# Patient Record
Sex: Female | Born: 1965 | Race: White | Hispanic: No | Marital: Married | Smoking: Current some day smoker
Health system: Southern US, Community
[De-identification: ages and names within clinical notes are randomized; demographics above are authoritative.]

## PROBLEM LIST (undated history)

## (undated) DIAGNOSIS — K635 Polyp of colon: Secondary | ICD-10-CM

## (undated) DIAGNOSIS — Z86010 Personal history of colonic polyps: Secondary | ICD-10-CM

## (undated) DIAGNOSIS — K589 Irritable bowel syndrome without diarrhea: Secondary | ICD-10-CM

## (undated) DIAGNOSIS — G4700 Insomnia, unspecified: Secondary | ICD-10-CM

## (undated) DIAGNOSIS — T7840XA Allergy, unspecified, initial encounter: Secondary | ICD-10-CM

## (undated) DIAGNOSIS — K921 Melena: Secondary | ICD-10-CM

## (undated) DIAGNOSIS — R55 Syncope and collapse: Secondary | ICD-10-CM

## (undated) DIAGNOSIS — N390 Urinary tract infection, site not specified: Secondary | ICD-10-CM

## (undated) DIAGNOSIS — F411 Generalized anxiety disorder: Secondary | ICD-10-CM

## (undated) DIAGNOSIS — J309 Allergic rhinitis, unspecified: Secondary | ICD-10-CM

## (undated) DIAGNOSIS — Z0000 Encounter for general adult medical examination without abnormal findings: Secondary | ICD-10-CM

## (undated) HISTORY — DX: Personal history of colonic polyps: Z86.010

## (undated) HISTORY — DX: Melena: K92.1

## (undated) HISTORY — DX: Allergy, unspecified, initial encounter: T78.40XA

## (undated) HISTORY — DX: Generalized anxiety disorder: F41.1

## (undated) HISTORY — DX: Insomnia, unspecified: G47.00

## (undated) HISTORY — DX: Allergic rhinitis, unspecified: J30.9

## (undated) HISTORY — DX: Polyp of colon: K63.5

## (undated) HISTORY — DX: Urinary tract infection, site not specified: N39.0

## (undated) HISTORY — DX: Encounter for general adult medical examination without abnormal findings: Z00.00

## (undated) HISTORY — DX: Syncope and collapse: R55

## (undated) HISTORY — DX: Irritable bowel syndrome without diarrhea: K58.9

---

## 1997-11-25 ENCOUNTER — Ambulatory Visit (HOSPITAL_COMMUNITY): Admission: RE | Admit: 1997-11-25 | Discharge: 1997-11-25 | Payer: Self-pay | Admitting: Obstetrics and Gynecology

## 1997-11-25 ENCOUNTER — Encounter: Payer: Self-pay | Admitting: Obstetrics and Gynecology

## 1998-12-09 ENCOUNTER — Other Ambulatory Visit: Admission: RE | Admit: 1998-12-09 | Discharge: 1998-12-09 | Payer: Self-pay | Admitting: Obstetrics and Gynecology

## 2000-02-03 ENCOUNTER — Other Ambulatory Visit: Admission: RE | Admit: 2000-02-03 | Discharge: 2000-02-03 | Payer: Self-pay | Admitting: Obstetrics and Gynecology

## 2001-01-30 ENCOUNTER — Other Ambulatory Visit: Admission: RE | Admit: 2001-01-30 | Discharge: 2001-01-30 | Payer: Self-pay | Admitting: Gastroenterology

## 2001-01-30 ENCOUNTER — Encounter (INDEPENDENT_AMBULATORY_CARE_PROVIDER_SITE_OTHER): Payer: Self-pay | Admitting: Specialist

## 2001-02-05 ENCOUNTER — Encounter: Payer: Self-pay | Admitting: Gastroenterology

## 2001-02-05 ENCOUNTER — Ambulatory Visit (HOSPITAL_COMMUNITY): Admission: RE | Admit: 2001-02-05 | Discharge: 2001-02-05 | Payer: Self-pay | Admitting: Gastroenterology

## 2001-02-09 ENCOUNTER — Encounter: Payer: Self-pay | Admitting: Gastroenterology

## 2001-02-09 ENCOUNTER — Encounter: Admission: RE | Admit: 2001-02-09 | Discharge: 2001-02-09 | Payer: Self-pay | Admitting: Gastroenterology

## 2001-02-19 ENCOUNTER — Encounter: Payer: Self-pay | Admitting: Emergency Medicine

## 2001-02-20 ENCOUNTER — Inpatient Hospital Stay (HOSPITAL_COMMUNITY): Admission: EM | Admit: 2001-02-20 | Discharge: 2001-02-22 | Payer: Self-pay | Admitting: Emergency Medicine

## 2001-02-21 ENCOUNTER — Encounter: Payer: Self-pay | Admitting: Internal Medicine

## 2001-02-22 ENCOUNTER — Encounter: Payer: Self-pay | Admitting: Internal Medicine

## 2001-09-11 ENCOUNTER — Other Ambulatory Visit: Admission: RE | Admit: 2001-09-11 | Discharge: 2001-09-11 | Payer: Self-pay | Admitting: Obstetrics and Gynecology

## 2001-11-06 ENCOUNTER — Encounter: Payer: Self-pay | Admitting: Gastroenterology

## 2001-11-06 ENCOUNTER — Encounter: Admission: RE | Admit: 2001-11-06 | Discharge: 2001-11-06 | Payer: Self-pay | Admitting: Gastroenterology

## 2001-11-07 ENCOUNTER — Encounter (INDEPENDENT_AMBULATORY_CARE_PROVIDER_SITE_OTHER): Payer: Self-pay | Admitting: Specialist

## 2001-11-07 ENCOUNTER — Ambulatory Visit (HOSPITAL_COMMUNITY): Admission: RE | Admit: 2001-11-07 | Discharge: 2001-11-07 | Payer: Self-pay | Admitting: Gastroenterology

## 2001-11-14 ENCOUNTER — Encounter: Payer: Self-pay | Admitting: Gastroenterology

## 2001-11-14 ENCOUNTER — Encounter: Admission: RE | Admit: 2001-11-14 | Discharge: 2001-11-14 | Payer: Self-pay | Admitting: Gastroenterology

## 2001-11-26 ENCOUNTER — Encounter (INDEPENDENT_AMBULATORY_CARE_PROVIDER_SITE_OTHER): Payer: Self-pay | Admitting: *Deleted

## 2001-11-26 ENCOUNTER — Ambulatory Visit (HOSPITAL_COMMUNITY): Admission: RE | Admit: 2001-11-26 | Discharge: 2001-11-26 | Payer: Self-pay | Admitting: Gastroenterology

## 2001-12-17 ENCOUNTER — Encounter: Admission: RE | Admit: 2001-12-17 | Discharge: 2001-12-17 | Payer: Self-pay | Admitting: Gastroenterology

## 2001-12-17 ENCOUNTER — Encounter: Payer: Self-pay | Admitting: Gastroenterology

## 2002-03-20 ENCOUNTER — Observation Stay (HOSPITAL_COMMUNITY): Admission: RE | Admit: 2002-03-20 | Discharge: 2002-03-21 | Payer: Self-pay | Admitting: Obstetrics and Gynecology

## 2002-03-20 ENCOUNTER — Encounter (INDEPENDENT_AMBULATORY_CARE_PROVIDER_SITE_OTHER): Payer: Self-pay

## 2002-08-14 ENCOUNTER — Encounter: Payer: Self-pay | Admitting: Gastroenterology

## 2002-08-14 ENCOUNTER — Encounter: Admission: RE | Admit: 2002-08-14 | Discharge: 2002-08-14 | Payer: Self-pay | Admitting: Gastroenterology

## 2003-04-12 HISTORY — PX: OTHER SURGICAL HISTORY: SHX169

## 2003-04-12 HISTORY — PX: ABDOMINAL HYSTERECTOMY: SHX81

## 2006-03-13 ENCOUNTER — Ambulatory Visit: Payer: Self-pay | Admitting: Internal Medicine

## 2006-03-13 LAB — CONVERTED CEMR LAB
Basophils Absolute: 0 10*3/uL (ref 0.0–0.1)
Basophils Relative: 0.8 % (ref 0.0–1.0)
Bilirubin Urine: NEGATIVE
CO2: 26 meq/L (ref 19–32)
Calcium: 8.9 mg/dL (ref 8.4–10.5)
Chloride: 110 meq/L (ref 96–112)
Glomerular Filtration Rate, Af Am: 102 mL/min/{1.73_m2}
Glucose, Bld: 98 mg/dL (ref 70–99)
HDL: 68.8 mg/dL (ref >39.0)
Hemoglobin, Urine: NEGATIVE
Ketones, ur: NEGATIVE mg/dL
LDL Cholesterol: 79 mg/dL (ref 0–99)
Lymphocytes Relative: 23.4 % (ref 12.0–46.0)
MCHC: 34.3 g/dL (ref 30.0–36.0)
MCV: 92 fL (ref 78.0–100.0)
Monocytes Absolute: 0.3 10*3/uL (ref 0.2–0.7)
Monocytes Relative: 5.3 % (ref 3.0–11.0)
Platelets: 245 10*3/uL (ref 150–400)
TSH: 1.04 microintl units/mL (ref 0.35–5.50)
Total Protein, Urine: NEGATIVE mg/dL
Triglyceride fasting, serum: 47 mg/dL (ref 0–149)
Urobilinogen, UA: 0.2 (ref 0.0–1.0)
VLDL: 9 mg/dL (ref 0–40)
pH: 7 (ref 5.0–8.0)

## 2006-03-16 ENCOUNTER — Ambulatory Visit: Payer: Self-pay | Admitting: Internal Medicine

## 2006-04-19 ENCOUNTER — Ambulatory Visit: Payer: Self-pay | Admitting: Internal Medicine

## 2008-02-01 ENCOUNTER — Encounter: Admission: RE | Admit: 2008-02-01 | Discharge: 2008-02-01 | Payer: Self-pay | Admitting: Obstetrics and Gynecology

## 2008-07-21 ENCOUNTER — Ambulatory Visit: Payer: Self-pay | Admitting: Internal Medicine

## 2008-07-21 DIAGNOSIS — Z860100 Personal history of colon polyps, unspecified: Secondary | ICD-10-CM | POA: Insufficient documentation

## 2008-07-21 DIAGNOSIS — K589 Irritable bowel syndrome without diarrhea: Secondary | ICD-10-CM

## 2008-07-21 DIAGNOSIS — F411 Generalized anxiety disorder: Secondary | ICD-10-CM

## 2008-07-21 DIAGNOSIS — J309 Allergic rhinitis, unspecified: Secondary | ICD-10-CM | POA: Insufficient documentation

## 2008-07-21 HISTORY — DX: Personal history of colonic polyps: Z86.010

## 2008-07-21 HISTORY — DX: Personal history of colon polyps, unspecified: Z86.0100

## 2008-07-21 HISTORY — DX: Allergic rhinitis, unspecified: J30.9

## 2008-07-21 HISTORY — DX: Irritable bowel syndrome, unspecified: K58.9

## 2008-07-21 HISTORY — DX: Generalized anxiety disorder: F41.1

## 2008-07-21 LAB — CONVERTED CEMR LAB
ALT: 12 units/L (ref 0–35)
BUN: 19 mg/dL (ref 6–23)
Bilirubin Urine: NEGATIVE
Bilirubin, Direct: 0.2 mg/dL (ref 0.0–0.3)
Chloride: 110 meq/L (ref 96–112)
Creatinine, Ser: 0.7 mg/dL (ref 0.4–1.2)
Eosinophils Absolute: 0.1 10*3/uL (ref 0.0–0.7)
Eosinophils Relative: 1.6 % (ref 0.0–5.0)
HDL: 74.5 mg/dL (ref >39.00)
Ketones, ur: NEGATIVE mg/dL
LDL Cholesterol: 84 mg/dL (ref 0–99)
MCV: 91.6 fL (ref 78.0–100.0)
Monocytes Absolute: 0.2 10*3/uL (ref 0.1–1.0)
Neutrophils Relative %: 83.3 % — ABNORMAL HIGH (ref 43.0–77.0)
Platelets: 238 10*3/uL (ref 150.0–400.0)
Total Bilirubin: 0.7 mg/dL (ref 0.3–1.2)
Total CHOL/HDL Ratio: 2
Triglycerides: 45 mg/dL (ref 0.0–149.0)
Urine Glucose: NEGATIVE mg/dL
Urobilinogen, UA: 0.2 (ref 0.0–1.0)
VLDL: 9 mg/dL (ref 0.0–40.0)
WBC: 9.1 10*3/uL (ref 4.5–10.5)

## 2009-04-29 ENCOUNTER — Encounter: Payer: Self-pay | Admitting: Internal Medicine

## 2009-05-13 ENCOUNTER — Telehealth: Payer: Self-pay | Admitting: Internal Medicine

## 2009-06-02 ENCOUNTER — Encounter: Payer: Self-pay | Admitting: Internal Medicine

## 2009-06-29 ENCOUNTER — Ambulatory Visit: Payer: Self-pay | Admitting: Internal Medicine

## 2009-06-29 LAB — CONVERTED CEMR LAB
AST: 15 units/L (ref 0–37)
BUN: 15 mg/dL (ref 6–23)
Bilirubin Urine: NEGATIVE
Calcium: 9 mg/dL (ref 8.4–10.5)
Cholesterol: 159 mg/dL (ref 0–200)
Eosinophils Absolute: 0.2 10*3/uL (ref 0.0–0.7)
GFR calc non Af Amer: 82.87 mL/min (ref >60)
HDL: 65.7 mg/dL (ref >39.00)
Ketones, ur: NEGATIVE mg/dL
LDL Cholesterol: 83 mg/dL (ref 0–99)
Lymphs Abs: 1.5 10*3/uL (ref 0.7–4.0)
MCHC: 33.5 g/dL (ref 30.0–36.0)
MCV: 94.9 fL (ref 78.0–100.0)
Monocytes Absolute: 0.3 10*3/uL (ref 0.1–1.0)
Neutrophils Relative %: 73.2 % (ref 43.0–77.0)
Nitrite: NEGATIVE
Platelets: 259 10*3/uL (ref 150.0–400.0)
Potassium: 4.3 meq/L (ref 3.5–5.1)
Sodium: 143 meq/L (ref 135–145)
Total Bilirubin: 0.6 mg/dL (ref 0.3–1.2)
Total Protein, Urine: NEGATIVE mg/dL
Urine Glucose: NEGATIVE mg/dL
VLDL: 10.6 mg/dL (ref 0.0–40.0)
pH: 6 (ref 5.0–8.0)

## 2009-07-01 ENCOUNTER — Ambulatory Visit: Payer: Self-pay | Admitting: Internal Medicine

## 2009-07-01 DIAGNOSIS — G4700 Insomnia, unspecified: Secondary | ICD-10-CM

## 2009-07-01 HISTORY — DX: Insomnia, unspecified: G47.00

## 2010-05-13 NOTE — Letter (Signed)
Summary: [facility]   Imported By: Sherian Rein 05/01/2009 07:40:45  _____________________________________________________________________  External Attachment:    Type:   Image     Comment:   External Document

## 2010-05-13 NOTE — Assessment & Plan Note (Signed)
Summary: CPX /NWS   #    Vital Signs:  Patient profile:   45 year old female Height:      60 inches Weight:      136.25 pounds BMI:     26.71 O2 Sat:      97 % on Room air Temp:     97.6 degrees F oral Pulse rate:   68 / minute BP sitting:   104 / 66  (left arm) Cuff size:   regular  Vitals Entered ByZella Ball Ewing (July 01, 2009 9:03 AM)  O2 Flow:  Room air  Preventive Care Screening  Mammogram:    Date:  09/09/2008    Results:  normal   Pap Smear:    Date:  09/09/2008    Results:  normal   CC: Adult Physical/RE   CC:  Adult Physical/RE.  History of Present Illness: overall doing well, except for increased stressors recent in the past month;  no worsening depressive symptoms, suicidal ideation or panic.  Has marked difficulty with getting to sleep and staying asleep as well.    Preventive Screening-Counseling & Management      Drug Use:  no.    Problems Prior to Update: 1)  Insomnia-sleep Disorder-unspec  (ICD-780.52) 2)  Preventive Health Care  (ICD-V70.0) 3)  Colonic Polyps, Hx of  (ICD-V12.72) 4)  Anxiety  (ICD-300.00) 5)  Allergic Rhinitis  (ICD-477.9) 6)  Ibs  (ICD-564.1)  Medications Prior to Update: 1)  Cetirizine Hcl 10 Mg Tabs (Cetirizine Hcl) .Marland Kitchen.. 1po Once Daily As Needed 2)  Alprazolam 0.25 Mg Tabs (Alprazolam) .Marland Kitchen.. 1 By Mouth Once Daily As Needed  Current Medications (verified): 1)  Cetirizine Hcl 10 Mg Tabs (Cetirizine Hcl) .Marland Kitchen.. 1po Once Daily As Needed 2)  Alprazolam 0.5 Mg Tabs (Alprazolam) .Marland Kitchen.. 1po Once Daily As Needed 3)  Nexium 40 Mg Cpdr (Esomeprazole Magnesium) .Marland Kitchen.. 1 By Mouth Once Daily 4)  Align  Caps (Probiotic Product) .Marland Kitchen.. 1 By Mouth Once Daily 5)  Zolpidem Tartrate 10 Mg Tabs (Zolpidem Tartrate) .Marland Kitchen.. 1po At Bedtime  Allergies (verified): 1)  ! Darvocet 2)  ! 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 DM - both sides HTN - both sides colon cancer - sister died  Social History: Reviewed history from 07/21/2008 and no changes required. Current Smoker Alcohol use-yes Married 2 children work - Health and safety inspector Drug use-no Drug Use:  no  Review of Systems  The patient denies anorexia, fever, vision loss, decreased hearing, hoarseness, chest pain, syncope, dyspnea on exertion, peripheral edema, prolonged cough, headaches, hemoptysis, abdominal pain, melena, hematochezia, severe indigestion/heartburn, hematuria, muscle weakness, suspicious skin lesions, difficulty walking, depression, unusual weight change, abnormal bleeding, enlarged lymph nodes, and angioedema.         all otherwise negative per pt -    Physical Exam  General:  alert and well-developed.   Head:  normocephalic and atraumatic.   Eyes:  vision grossly intact, pupils  equal, and pupils round.   Ears:  R ear normal and L ear normal.   Nose:  no external deformity and no nasal discharge.   Mouth:  no gingival abnormalities and pharynx pink and moist.   Neck:  supple and no masses.   Lungs:  normal respiratory effort and normal breath sounds.   Heart:  normal rate and regular rhythm.   Abdomen:  soft, non-tender, and normal bowel sounds.   Msk:  no joint tenderness and no joint swelling.   Extremities:  no edema, no erythema  Neurologic:  cranial nerves II-XII intact and strength normal in all  extremities.   Skin:  color normal and no rashes.   Psych:  normally interactive and moderately anxious.     Impression & Recommendations:  Problem # 1:  Preventive Health Care (ICD-V70.0)  Overall doing well, age appropriate education and counseling updated and referral for appropriate preventive services done unless declined, immunizations up to date or declined, diet counseling done if overweight, urged to quit smoking if smokes , most recent labs reviewed and current ordered if appropriate, ecg reviewed or declined (interpretation per ECG scanned in the EMR if done); information regarding Medicare Prevention requirements given if appropriate   Orders: EKG w/ Interpretation (93000)  Problem # 2:  ANXIETY (ICD-300.00)  Her updated medication list for this problem includes:    Alprazolam 0.5 Mg Tabs (Alprazolam) .Marland Kitchen... 1po once daily as needed increase as above  Problem # 3:  INSOMNIA-SLEEP DISORDER-UNSPEC (ICD-780.52)  Her updated medication list for this problem includes:    Zolpidem Tartrate 10 Mg Tabs (Zolpidem tartrate) .Marland Kitchen... 1po at bedtime treat as above, f/u any worsening signs or symptoms   Complete Medication List: 1)  Cetirizine Hcl 10 Mg Tabs (Cetirizine hcl) .Marland Kitchen.. 1po once daily as needed 2)  Alprazolam 0.5 Mg Tabs (Alprazolam) .Marland Kitchen.. 1po once daily as needed 3)  Nexium 40 Mg Cpdr (Esomeprazole magnesium) .Marland Kitchen.. 1 by mouth once daily 4)  Align Caps (Probiotic product) .Marland Kitchen.. 1 by mouth once daily 5)  Zolpidem Tartrate 10 Mg Tabs (Zolpidem tartrate) .Marland Kitchen.. 1po at bedtime  Patient Instructions: 1)  Continue all previous medications as before this visit 2)  Please schedule a follow-up appointment in 1 year or sooner if needed Prescriptions: ZOLPIDEM TARTRATE 10 MG TABS (ZOLPIDEM TARTRATE) 1po at bedtime  #30 x 5   Entered and Authorized by:   Corwin Levins MD   Signed by:   Corwin Levins MD on 07/01/2009   Method used:   Print then Give to Patient   RxID:    [id number] CETIRIZINE HCL 10 MG TABS (CETIRIZINE HCL) 1po once daily as needed  #30 x 11   Entered and Authorized by:   Corwin Levins MD   Signed by:   Corwin Levins MD on 07/01/2009   Method used:   Print then Give to Patient   RxID:   [id number] ALPRAZOLAM 0.5 MG TABS (ALPRAZOLAM) 1po once daily as needed  #90 x 3   Entered and Authorized by:   Corwin Levins MD   Signed by:   Corwin Levins MD on 07/01/2009   Method used:   Print then Give to Patient   RxID:   [id number]

## 2010-05-13 NOTE — Letter (Signed)
Summary: [facility]   Imported By: Sherian Rein 06/04/2009 09:53:27  _____________________________________________________________________  External Attachment:    Type:   Image     Comment:   External Document

## 2010-05-13 NOTE — Progress Notes (Signed)
Summary: med refill  Phone Note Refill Request  on May 13, 2009 8:17 AM  Refills Requested: Medication #1:  ALPRAZOLAM 0.25 MG TABS 1 by mouth once daily as needed.   Dosage confirmed as above?Dosage Confirmed   Last Refilled: 07/21/2008   Notes: [facility] [address] [place] ZOX#[phone] Initial call taken by: Scharlene Gloss,  May 13, 2009 8:18 AM  Follow-up for Phone Call        done hardcopy to LIM side B - dahlia   Follow-up by: Corwin Levins MD,  May 13, 2009 8:43 AM  Additional Follow-up for Phone Call Additional follow up Details #1::        rx faxed to pharmacy Additional Follow-up by: Margaret Pyle, CMA,  May 13, 2009 8:52 AM    New/Updated Medications: ALPRAZOLAM 0.25 MG TABS (ALPRAZOLAM) 1 by mouth once daily as needed Prescriptions: ALPRAZOLAM 0.25 MG TABS (ALPRAZOLAM) 1 by mouth once daily as needed  #30 x 2   Entered and Authorized by:   Corwin Levins MD   Signed by:   Corwin Levins MD on 05/13/2009   Method used:   Print then Give to Patient   RxID:   [id number]

## 2010-05-20 ENCOUNTER — Telehealth: Payer: Self-pay | Admitting: Internal Medicine

## 2010-05-21 ENCOUNTER — Telehealth: Payer: Self-pay | Admitting: Internal Medicine

## 2010-05-21 ENCOUNTER — Encounter: Payer: Self-pay | Admitting: Internal Medicine
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
## 2010-08-27 NOTE — Op Note (Signed)
Cassidy Lee, Cassidy Lee                         ACCOUNT NO.:  [id number]   MEDICAL RECORD NO.:  [id number]                   PATIENT TYPE:  AMB   LOCATION:  ENDO                                 FACILITY:  [facility]   PHYSICIAN:  Petra Kuba, M.D.                 DATE OF BIRTH:  [birth year]   DATE OF PROCEDURE:  11/07/2001  DATE OF DISCHARGE:                                 OPERATIVE REPORT   PROCEDURE:  Esophagogastroduodenoscopy with biopsy.   INSTRUMENT USED:  Pediatric colonoscope.   INDICATIONS FOR PROCEDURE:  Pain, abnormal CT scan.  Consent was signed  after risks, benefits, methods, and options were thoroughly discussed both  in the office and before any premeds given.   ANESTHESIA:  Demerol 100 mg, Versed 10 mg.     PROCEDURE:  Using the pediatric video colonoscope, it was inserted by direct  vision and advanced through a normal esophagus, through her tiny hiatal  hernia, into the stomach through a normal antrum and pylorus, into the  duodenal bulb, around the C-loop and advanced around the duodenum and we  believe past the ligament of Treitz to the proximal jejunum.  At that point,  the scope began to loop whenever we tried to advance.  We did try some  abdominal pressure which was not successful.  The patient did have some pain  at this juncture with the increased advancing, so we elected to withdraw.  No obvious small bowel abnormality was seen on insertion and no obvious  small bowel abnormality seen in the distance.  We went ahead and took some  scattered biopsies of the duodenum and jejunum on slow withdrawal.   Possibly on withdrawal, some of the duodenal folds were slightly edematous.  Possibly this was due to the scope.  Possibly the advancement and  withdrawing, but no other abnormalities were seen.  A good look at the bulb  was normal.  The stomach was evaluated on straight and retroflexed  visualization without any abnormalities.  There were suctions.  The  scope  slowly withdrawn.  Again, a good look at the esophagus was normal.  Scope  removed.  The patient tolerated the procedure well.  There was no evidence  of any complication.   ENDOSCOPIC DIAGNOSES:  1. Tiny hiatal hernia.  2. Questionable slight edematous folds of the distal duodenum on withdrawal     only, status post biopsy.  3. Otherwise, within normal limits to what  I believe was the proximal     jejunum with random biopsies of the proximal jejunum and the duodenum on     slow withdrawal.   PLAN:  Await pathology.  Consider ultrasound of her gallbladder and small  bowel follow-through next.  Will try Ultram for her pain in the meantime.  Petra Kuba, M.D.    MEM/MEDQ  D:  11/07/2001  T:  11/10/2001  Job:  [id number]

## 2010-08-27 NOTE — Discharge Summary (Signed)
[facility]  Patient:    Cassidy Lee, Cassidy Lee Visit Number: [id number] MRN: [id number]          Service Type: MED Location: [phone] [id number] Attending Physician:  Duke Salvia Dictated by:   Rosalyn Gess. Norins, M.D. [facility] Admit Date:  02/19/2001 Discharge Date: 02/22/2001   CC:         Vania Rea. Jarold Motto, M.D. [facility]  Roxy Manns, M.D. [facility]   Discharge Summary  ADMITTING DIAGNOSES: 1. Diarrhea, question of infectious origin. 2. Back pain. 3. Recent urinary tract infection, resolved.  DISCHARGE DIAGNOSES: 1. Diarrhea, question of infectious origin. 2. Back pain. 3. Recent urinary tract infection, resolved.  CONSULTANTS:  Malcolm T. Pleas Koch., M.D., for gastroenterology.  PROCEDURES: 1. CT scan of the abdomen and pelvis performed February 19, 2001, which was    normal in the abdomen which revealed a 1.5 simple cyst of the right ovary    on pelvic exam. 2. Complete abdominal ultrasound performed November 13 was normal with no    evidence of cholelithiasis or gallbladder thickening. 3. Lumbar spine films performed February 22, 2001, which revealed no    significant change or acute findings with mild leftward scoliosis.  HISTORY OF PRESENT ILLNESS:  Patient is a 45 year old married white female who reports undergoing colonoscopy with polypectomy one month prior to admission, that she has had a sensation of bloating, gas, distention of her abdomen, and loose watery stools, up to 10 a day.  She has been able to eat and drink but had frequent episodes of nausea.  The patient had been seen in the [facility] office by Genevie Cheshire ______, Publishing rights manager, on multiple occasions.  She also had symptoms of chronic low back pain with these symptoms.  Her evaluation included urinalysis which was positive leading to treatment for a urinary tract infection.  She also underwent IVP which was negative for nephrolithiasis.  Because of the persistent diarrhea and  discomfort, patient presented to [facility] emergency department where she was subsequently admitted following evaluation.  HOSPITAL COURSE: #1 - DIARRHEA:  The patient was admitted to a regular bed.  She was seen in consultation by GI.  Previously, as an outpatient, the patient had had multiple stool cultures and a study which had been unremarkable.  In the hospital, patient had stool for C. difficile which was negative.  She had cultures for routine organism which was negative.  She had no parasites or ova.  The patient was seen in consultation by GI who felt she may have an infectious colitis although she was at risk for possible chemical colitis from scope cleaning fluids.  They recommended Flagyl empirically and this was started.  She was continued on this regimen.  Her number of bowel movements declined significantly to where she was having two to three per day that were semi-formed.  Because of the patients stable diarrhea with presumption of C. difficile or infectious colitis, she was felt to be stable and ready for discharge home.  She will continue 14 days of Flagyl as an outpatient as well as Lactinex and Robinul b.i.d.  She was to see Dr. Sheryn Bison in follow-up.  #2 - LOW BACK PAIN:  Patient was initially treated with Toradol with Demerol for breakthrough pain.  Patient was switched on November 13 to Celebrex 200 mg b.i.d. and Demerol and Toradol were discontinued.  Patients lumbar spine films were unremarkable.  She was felt to have back pain that could be worked up as an  outpatient.  With the patient being stable from a GI perspective, she was thought to be ready for discharge home.  Her vital signs were stable at discharge.  Her abdominal examination was unremarkable.  DISCHARGE INSTRUCTIONS:  Patient was to take Flagyl 250 mg q.i.d. until completed.  She was to take Lactinex two pills twice daily for her bowels. She was to take Robinul Forte twice daily for bowel  pain and dizziness.  She was to continue Celebrex 200 mg b.i.d. for back pain, Vicodin only if needed for pain.  She was to follow a low-residue, minimal lactose diet.  She was to see Dr. Roxy Manns for follow-up for her back pain.  She was to see Dr. Sheryn Bison on Wednesday, November 27, for follow-up of her GI problems.  Patients condition at the time of discharge dictation was stable and improved. Dictated by:   Rosalyn Gess. Norins, M.D. [facility] Attending Physician:  Duke Salvia DD:  03/04/01 TD:  03/05/01 Job: [id number]

## 2010-08-27 NOTE — Op Note (Signed)
NAME:  Cassidy Lee, Cassidy Lee                          ACCOUNT NO.:  [id number]   MEDICAL RECORD NO.:  [id number]                   PATIENT TYPE:  AMB   LOCATION:  ENDO                                 FACILITY:  [facility]   PHYSICIAN:  Petra Kuba, M.D.                 DATE OF BIRTH:  10/13/1965   DATE OF PROCEDURE:  11/26/2001  DATE OF DISCHARGE:                                 OPERATIVE REPORT   PROCEDURE:  Colonoscopy with biopsy.   INDICATIONS:  The patient with diarrhea, weight loss, multiple GI  complaints, nondiagnostic workup to date.  Consent was signed after risks,  benefits, methods, and options thoroughly discussed in the office.   MEDICATIONS:  Demerol 120 mg, Versed 12 mg.   DESCRIPTION OF PROCEDURE:  Rectal inspection was pertinent for external  hemorrhoids, small.  Digital rectal exam negative.  The video pediatric  adjustable colonoscope was inserted and with minimal difficulty using some  abdominal pressure, we were able to advance to the cecum.  No obvious  abnormality was seen on insertion.  There was no sign of polyposis syndrome.  Cecum was identified by the appendiceal orifice and the ileocecal valve.  In  fact, the scope was inserted a short way into the terminal ileum, which was  normal.  Photo documentation and random biopsies were obtained, and the  scope was slowly withdrawn.  Prep was adequate.  There was some liquid stool  that required ashing and suctioning and on slow withdrawal through the  colon, random biopsies were obtained and put in the second container.  On  slow withdrawal through the colon the cecum, ascending, transverse, and  descending were normal.  In the sigmoid were three tiny hyperplastic-  appearing polyps which were cold-biopsied and put in the third container.  Once back in the rectum the scope was retroflexed, pertinent for some  internal hemorrhoids, small.  The scope was straightened and readvanced a  short way up the left side of the  colon, air was suctioned, and the scope  removed.  The patient tolerated the procedure well.  There was no obvious  immediate complication.   ENDOSCOPIC DIAGNOSES:  1. Internal-external small hemorrhoids.  2. Three tiny probable hyperplastic-appearing sigmoid polyps, cold biopsied.  3. Otherwise within normal limits to the terminal ileum with random biopsies     of the colon and the terminal ileum.   PLAN:  Await pathology.  Consider repeat CT, rechecking labs, empiric  antibiotics, bacterial overgrowth breath test, or even a university consult.  Probably the patient could benefit from an antidepressant.  Possibly between  now and the time I see her back, should follow up with Ms. Shelle Iron to see if  she has any other thoughts, workup plans, or medicine trials like the  antidepressant, which might help her GI symptoms.  Happy to see back sooner  p.r.n. or in six weeks.  Petra Kuba, M.D.    MEM/MEDQ  D:  11/26/2001  T:  11/27/2001  Job:  [id number]   cc:   Legrand Rams, M.D., [facility]

## 2010-08-27 NOTE — Op Note (Signed)
NAME:  Cassidy Lee, Cassidy Lee                          ACCOUNT NO.:  [id number]   MEDICAL RECORD NO.:  [id number]                   PATIENT TYPE:  OBV   LOCATION:  9313                                 FACILITY:  [facility]   PHYSICIAN:  Juluis Mire, M.D.                DATE OF BIRTH:  March 29, 1966   DATE OF PROCEDURE:  03/20/2002  DATE OF DISCHARGE:                                 OPERATIVE REPORT   PREOPERATIVE DIAGNOSES:  Adenomyosis.   POSTOPERATIVE DIAGNOSES:  Adenomyosis.   OPERATIVE PROCEDURE:  Laparoscopically assisted vaginal hysterectomy.   SURGEON:  Juluis Mire, M.D.   ASSISTANT:  Raynald Kemp, M.D.   ANESTHESIA:  General endotracheal.   ESTIMATED BLOOD LOSS:  300 cc.   PACKS AND DRAINS:  None.   INTRAOPERATIVE BLOOD PLACED:  None.   COMPLICATIONS:  None.   INDICATIONS:  Noted in the history and physical.   PROCEDURE AS FOLLOWS:  The patient was taken to the OR and placed in supine  position.  After a satisfactory level of general endotracheal anesthesia was  obtained, patient was placed in the dorsal lithotomy position using the  Allen stirrups.  The abdomen, perineum, and vagina were prepped out with  Betadine.  Bladder was emptied with in-and-out catheterization.  A Hulka  tenaculum was put in place and secured.  The patient was then draped out for  laparoscopy.  A subumbilical incision was made with the knife and extended  through the subcutaneous tissue.  The fascia was entered sharply and the  incision of the fascia extended laterally.  Peritoneum was identified,  entered sharply.  Two lateral sutures of 0 Vicryl were put in place and  held.  The open laparoscopic cannula was put in place and secured.  Laparoscope was introduced.  There was no evidence of injury to adjacent  organs.  A 5 mm trocar was put in place in the suprapubic area under direct  visualization.  Uterus was upper limits, normal size.  Tubes and ovaries  were unremarkable.  Appendix was  visualized, noted to be normal.  Upper  abdomen including liver and tip of the gallbladder were clear.  There was no  evidence of pelvic pathology.  Next, using the plasma kinetic tripolar the  right round ligament was cauterized and incised.  The right tube and  mesosalpinx were cauterized and incised.  The right utero-ovarian pedicle  was cauterized and incised.  Next, the left round ligament was cauterized  and incised.  The left tube and mesosalpinx were cauterized and incised.  The left utero-ovarian pedicle was cauterized and incised.  We had good  hemostasis.  The abdomen was deinflated of its carbon dioxide.  The  laparoscope was then removed.   The patient's legs were repositioned.  The Hulka tenaculum was then removed.  A weighted speculum was placed in the vaginal vault.  The posterior lip of  the cervix was grasped with a Christella Hartigan' tenaculum.  Cul-de-sac was entered  sharply.  Uterosacral ligaments were clamped, cut, and suture ligated with 0  Vicryl.  The reflection of the vaginal mucosa anteriorly was incised using  the Bovie and the bladder was dissected superiorly.  Paracervical tissue was  clamped, cut, and suture ligated with 0 Vicryl.  Vesicouterine space was  identified and entered and the retractor was put in place.  Using the clamp,  cut, and tie technique with suture ligatures of 0 Vicryl, the parametrium  was serially separated from the sides of the uterus.  Uterus was then  flipped and remaining pedicles were clamped/cut.  Uterus passed off the  operative field.  The held pedicles were secured with free ties of 0 Vicryl.  We had good hemostasis.  Uterosacral plication stitch was put in place of 0  Vicryl.  The vaginal mucosa reapproximated in the midline in a vertical  fashion with interrupted figure-of-eight with 0 Vicryl.  Sponge on sponge  stick was then placed in the vaginal vault.   Legs were repositioned.  The Foley was placed to straight drain with   retrieval of adequate amount of clear urine.  Abdomen was reinflated of its  carbon dioxide.  Laparoscopic evaluation revealed hemostatic vaginal cuff  and ovarian pedicles.  Abdomen was deflated of its carbon dioxide, all  trocars removed.  Subumbilical fascia was closed with interrupted figure-of-  eight of 0 Vicryl.  Skin was closed with interrupted subcuticulars of 3-0  Vicryl.  Suprapubic incision was closed with Steri-Strips.  The patient  taken out of the dorsal lithotomy position, once alert and extubated  transferred to recovery room in good condition.  Sponge, instrument, and  needle count reported as correct by circulating nurse x2.                                               Juluis Mire, M.D.    JSM/MEDQ  D:  03/20/2002  T:  03/20/2002  Job:  [id number]

## 2010-08-27 NOTE — H&P (Signed)
[facility]  Patient:    Cassidy Lee, Cassidy Lee Visit Number: [id number] MRN: [id number]          Service Type: MED Location: 1E 0101 01 Attending Physician:  Hanley Seamen Dictated by:   Rosalyn Gess. Norins, M.D. [facility] Admit Date:  02/19/2001   CC:         Vania Rea. Jarold Motto, M.D. [facility]  Myer Peer, N.P., [facility]   History and Physical  CHIEF COMPLAINT:  Abdominal pain and diarrhea.  HISTORY OF PRESENT ILLNESS:  Cassidy Lee is a 45 year old, married, white female with no previous GI problems.  She has a very strong family history for colon cancer and Gardener syndrome.  She underwent colonoscopy with polypectomy one month prior to admission.  Since that procedure she reports she has had a sensation of bloating, gas, and distention in her abdomen.  She has had loose watery diarrhea with 10+ bowel movements daily.  She has been able to eat and drink, but has frequent episodes of nausea.  She has been seen at the [facility] by Myer Peer, nurse practitioner, on multiple occasions for this problem with no resolution.  She also has had symptoms of chronic low back pain with these other symptoms.  She has had no history of back pain or prior injury.  Her evaluation included urinalysis which was positive for blood leading to the patient having an IVP which was negative for stone.  CBC did reveal a leukocytosis of 13,000.  The patient was treated with ciprofloxacin for a probable UTI.  The patients diarrhea was unabated.  She was given a trial of hyoscyamine which she reports only made her diarrhea worse.  Because of her ongoing diarrhea, ongoing back pain, and because of increasing abdominal pain over the last 24 hours, she presented to the [facility] Emergency Department for evaluation.  Over a period of six hours her evaluation was unremarkable with a normal CMET, normal CBC with differential, and negative UA.  The CT scan of abdomen  was negative, except for an ovarian cyst.  The patient did have an elevated amylase at 241.  It is noteworthy that the patient does have a history of intra-abdominal adhesions.  Because of her ongoing symptoms and diarrhea, the patient is now admitted for further evaluation and IV hydration.  PAST SURGICAL HISTORY: 1. C-section two times. 2. The patient had laparoscopy for abdominal pain and was found to have    significant adhesions and a ruptured fallopian tube. 3. She has had foot surgery.  PAST MEDICAL HISTORY: 1. Usual childhood diseases. 2. Chronic irregular menses. 3. Recurrent ovarian cysts. 4. She is a gravida 5, para 2, and 3 SABs.  CURRENT MEDICATIONS:  Depo-Provera shots every three months.  HABITS:  Tobacco one pack-per-day.  Alcohol two to three ounces per week.  ALLERGIES:  CODEINE causing urticaria, DARVOCET causing urticaria.  FAMILY HISTORY:  Negative for breast cancer or coronary artery disease. Positive for diabetes, positive for hypertension, and very strongly positive for colon cancer with it occurring on paternal grandfather, paternal grandmother, uncle, cousins.  SOCIAL HISTORY:  The patient has been married 11 years.  She has a son age 69 and daughter age 54.  The patient works as an Research scientist (physical sciences).  She reports her marriage is in good health.  PHYSICAL EXAMINATION:  VITAL SIGNS:  Temperature 99.3, blood pressure 149/72, pulse 77, respirations 20.  GENERAL:  This is a well-nourished, well-developed, white female  who does not appear toxic.  HEENT:  Unremarkable for any abnormality.  NECK:  Supple without thyromegaly.  NODES:  No adenopathy is noted in the cervical or inguinal region.  LUNGS:  Clear without rales, wheezes, rhonchi.  BREASTS:  Deferred.  HEART:  A 2+ radial pulse and 2+ dorsalis pedis pulse.  She had a regular rate and rhythm without murmur, rub or gallop.  ABDOMEN:  The patient had bowel sounds that  were positive in all four quadrants.  She had no guarding or rebound.  She had moderate tenderness to deep palpation in the epigastrium.  No tenderness in right upper quadrant.  PELVIC/RECTAL:  Deferred.  EXTREMITIES:  Unremarkable.  NEUROLOGIC:  Normal.  LABORATORY:  CBC:  White count 5500, hemoglobin 12.4, hematocrit 35, platelet 217.  CMET:  Normal with sodium 142, potassium 3.6, glucose 97, BUN 10, creatinine 0.9, AST and ALT normal, albumin normal, total protein normal, alkaline phosphatase normal, and total bilirubin normal.  Amylase was elevated at 215.  X-RAY:  CT scan negative by verbal report.  ASSESSMENT/PLAN: 1. Gastrointestinal:  The patient with a greater than three week history of    persistent watery diarrhea that began after colonoscopy.  She has no    history of antibiotic use preceding diarrhea.  She has no prior    gastrointestinal problems, except for constipation.  The patient does have    a mildly elevated amylase, but her clinical picture is not consistent with    pancreatitis.  There is no evidence of gallstones.  Question whether the    patient has malobstruction syndrome at this time versus bacterial diarrhea,    although there has been no blood in the stool, but there has been stringy    mucous in the stool.  Possibility for C. diff, although the patient has not    had antibiotics recently.  The patient is admitted to the hospital.  Will    send stool specimens for C. diff, salmonella, Shigella, WBCs.  The patient    may need further stool studies and will defer to GI and GI consult.  The    patient will have a lipase added to her laboratory in regards to possible    pancreatitis. 2. Back pain:  The patient with new back pain with no injury reported.  She    has had no radiation of pain to her lower extremities, she has had no    paresthesias, and she has had no focal weakness.  The pain waxes and wanes     and sometimes is not severe.  The patient did  take Vicodin initially for    discomfort, but this stopped giving her relief.  The patient did have IVP    to rule out kidney stone which was negative.  It is noteworthy she has a    history of adhesions.  Will get LS-spine films to rule out any mechanical    abnormality. 3. Genitourinary:  The patient with urinary tract infection that is resolved.  This is a pleasant 44 year old woman with a several week history of watery diarrhea in her stools of unknown etiology. Dictated by:   Rosalyn Gess. Norins, M.D. [facility] Attending Physician:  Hanley Seamen DD:  02/20/01 TD:  02/20/01 Job: [id number]

## 2010-08-27 NOTE — H&P (Signed)
NAME:  Cassidy Lee, TREPTOW                          ACCOUNT NO.:  [id number]   MEDICAL RECORD NO.:  [id number]                   PATIENT TYPE:  AMB   LOCATION:  SDC                                  FACILITY:  [facility]   PHYSICIAN:  Juluis Mire, M.D.                DATE OF BIRTH:  August 08, 1965   DATE OF ADMISSION:  03/20/2002  DATE OF DISCHARGE:                                HISTORY & PHYSICAL   HISTORY OF PRESENT ILLNESS:  The patient is a 45 year old gravida 5 para 2  abortus 3 female who presents for laparoscopy-assisted vaginal hysterectomy.   In relation to the present admission, the patient has had a longstanding  history of chronic pelvic pain and discomfort.  She is having increasing  discomfort despite being on Depo-Provera.  Associated with this is  increasing pain with intercourse.  This has become extremely limiting.  She  underwent diagnostic laparoscopy with laser standby in October 2003.  It was  noted at that time she had some minimal omental adhesions.  There was no  pelvic endometriosis though overall uterine size was enlarged thought to be  secondary to adenomyosis.  Because of persistent symptomatology unresponsive  to conservative medical management or surgical management the patient  presents now for laparoscopy-assisted vaginal hysterectomy.  She has had  previous complete GI workup by Dr. Ewing Schlein with negative findings.   ALLERGIES:  No known drug allergies.   MEDICATIONS:  Depo-Provera.   PAST MEDICAL HISTORY:  Usual childhood diseases without any significant  sequelae.  She has had a previous colonoscopy done with finding of numerous  polyps but no other active processes.  She did have a previous diagnostic  laparoscopy done in the past with the removal of a right-sided hydrosalpinx  in 1998.  She has also had two prior cesarean sections.   FAMILY HISTORY:  Maternal grandmother with history of hypertension as well  as bone cancer.  Maternal grandmother also had  diabetes.   SOCIAL HISTORY:  Reveals one pack per day tobacco use.  No alcohol use.   REVIEW OF SYSTEMS:  Noncontributory.   PHYSICAL EXAMINATION:  VITAL SIGNS:  The patient is afebrile with stable  vital signs.  HEENT:  The patient is normocephalic.  Pupils were equal and reactive to  light and accomodation.  Extraocular movements were intact.  Sclerae and  conjunctivae clear, oropharynx clear.  NECK:  Without thyromegaly.  BREASTS:  No discrete masses.  LUNGS:  Clear.  CARDIOVASCULAR:  Regular rhythm and rate with no murmurs or gallops.  ABDOMEN:  Benign.  No mass, organomegaly, or tenderness.  PELVIC:  Normal external genitalia, vaginal mucosa clear.  Cervix  unremarkable.  Uterus upper limits of normal size, mildly tender. Adnexa  unremarkable.  EXTREMITIES:  Trace edema.  NEUROLOGIC:  Grossly within normal limits.   IMPRESSION:  Continued pelvic pain and dyspareunia probably secondary to  uterine adenomyosis.  PLAN:  The patient to undergo laparoscopy-assisted vaginal hysterectomy.  The risks of surgery have been discussed including the risk of infection;  the risk of hemorrhage that could necessitate transfusion with the risk of  AIDS or hepatitis; the risk of injury to adjacent organs including bladder,  bowel, or ureters that could require further exploratory surgery; the risk  of deep venous thrombosis and pulmonary embolus.  The patient professed an  understanding of the indications and risks and is accepting of them.  We  have also discussed the potential risk of persistent pain and discomfort  that is unresponsive to this surgical management.  The patient professed  understanding.                                                Juluis Mire, M.D.    JSM/MEDQ  D:  03/19/2002  T:  03/19/2002  Job:  [id number]

## 2010-08-27 NOTE — Discharge Summary (Signed)
   NAME:  Cassidy Lee, Cassidy Lee                          ACCOUNT NO.:  [id number]   MEDICAL RECORD NO.:  [id number]                   PATIENT TYPE:  OBV   LOCATION:  9313                                 [facility]ACILITY:  [facility]   PHYSICIAN:  Juluis Mire, M.D.                DATE OF BIRTH:  10-Aug-1965   DATE OF ADMISSION:  03/20/2002  DATE OF DISCHARGE:  03/21/2002                                 DISCHARGE SUMMARY   ADMITTING DIAGNOSES:  Uterine adenomyosis.   DISCHARGE DIAGNOSES:  Uterine adenomyosis with pathology pending.   OPERATIVE PROCEDURE:  Laparoscopically assisted vaginal hysterectomy.   HISTORY OF PRESENT ILLNESS:  For complete history and physical, please see  dictated note.   HOSPITAL COURSE:  The patient underwent laparoscopic assisted vaginal  hysterectomy.  Postoperative did well.  Postoperative hemoglobin 10.5.  Discharged home on first postoperative day.  At that time she was afebrile  with stable vital signs.  She was tolerating liquids and ambulating without  difficulty.  Foley had been removed and she was voiding without difficulty.  She had no active vaginal bleeding.  Abdominal examination was benign with  active bowel sounds.  Subumbilical and suprapubic incisions were intact and  unremarkable.   In terms of complications, none encountered during stay in hospital.  The  patient is discharged home in stable condition.   DISPOSITION:  Routine postoperative instruction booklet was given.  She is  to avoid heavy lifting, vaginal entrance, or driving of a car.  She will  watch for signs of infection, nausea, vomiting, increasing abdominal pain,  or active vaginal bleeding.  Follow up in the office in one week.  Discharged home on Tylox as needed for pain.                                               Juluis Mire, M.D.    JSM/MEDQ  D:  03/21/2002  T:  03/21/2002  Job:  [id number]

## 2010-09-13 ENCOUNTER — Other Ambulatory Visit: Payer: Self-pay | Admitting: Internal Medicine

## 2012-11-09 ENCOUNTER — Telehealth: Payer: Self-pay

## 2012-11-09 NOTE — Telephone Encounter (Signed)
The patient called from out of town concerning an infection in her gum.The patient was requesting an antibiotic to be sent to pharmacy.  MD informed to schedule appt. With [facility] or urgent care as the patient has not been seen by PCP for over 2 years.

## 2012-11-09 NOTE — Telephone Encounter (Signed)
I agree and certify I verbally instructed Cassidy Lee to inform pt

## 2012-11-09 NOTE — Telephone Encounter (Signed)
Patient was later informed that due to the fact she actually had not been seen since 2011 she would need to go to Urgent care and to schedule a 30 minute appointment with her PCP at her convenience.

## 2013-06-19 ENCOUNTER — Encounter: Payer: Self-pay | Admitting: Internal Medicine

## 2013-06-19 ENCOUNTER — Ambulatory Visit (INDEPENDENT_AMBULATORY_CARE_PROVIDER_SITE_OTHER): Payer: No Typology Code available for payment source | Admitting: Internal Medicine

## 2013-06-19 VITALS — BP 122/80 | HR 67 | Temp 97.7°F | Ht 60.0 in | Wt 132.8 lb

## 2013-06-19 DIAGNOSIS — Z0000 Encounter for general adult medical examination without abnormal findings: Secondary | ICD-10-CM

## 2013-06-19 DIAGNOSIS — K921 Melena: Secondary | ICD-10-CM | POA: Insufficient documentation

## 2013-06-19 DIAGNOSIS — F17200 Nicotine dependence, unspecified, uncomplicated: Secondary | ICD-10-CM

## 2013-06-19 DIAGNOSIS — G4700 Insomnia, unspecified: Secondary | ICD-10-CM

## 2013-06-19 DIAGNOSIS — Z0001 Encounter for general adult medical examination with abnormal findings: Secondary | ICD-10-CM | POA: Insufficient documentation

## 2013-06-19 HISTORY — DX: Encounter for general adult medical examination without abnormal findings: Z00.00

## 2013-06-19 MED ORDER — ALPRAZOLAM 0.25 MG PO TABS: 0.2500 mg | ORAL_TABLET | Freq: Every evening | ORAL | Status: DC | PRN

## 2013-06-19 NOTE — Progress Notes (Signed)
Pre visit review using our clinic review tool, if applicable. No additional management support is needed unless otherwise documented below in the visit note. 

## 2013-06-19 NOTE — Assessment & Plan Note (Signed)
Urged to quit 

## 2013-06-19 NOTE — Assessment & Plan Note (Signed)
For xanax .25 qhs prn

## 2013-06-19 NOTE — Assessment & Plan Note (Signed)
With abd pain, suspect recent episode ischemic colitis, exam benign now, for f/u labs and INR, refer GI - Dr Watt Climes if ok with insurance

## 2013-06-19 NOTE — Progress Notes (Signed)
Subjective:    Patient ID: Cassidy Lee, female    DOB: [birth year], 48 y.o.   MRN: [id number]  HPI  Here for wellness and re-stablish after lost to f/u > 3 yrs;  Overall doing ok;  Pt denies CP, worsening SOB, DOE, wheezing, orthopnea, PND, worsening LE edema, palpitations, dizziness or syncope.  Pt denies neurological change such as new headache, facial or extremity weakness.  Pt denies polydipsia, polyuria, or low sugar symptoms. Pt states overall good compliance with treatment and medications, good tolerability, and has been trying to follow lower cholesterol diet.  Pt denies worsening depressive symptoms, suicidal ideation or panic. No fever, night sweats, wt loss, loss of appetite, or other constitutional symptoms.  Pt states good ability with ADL's, has low fall risk, home safety reviewed and adequate, no other significant changes in hearing or vision, and only occasionally active with exercise. Is smoking, not ready to quit.  Has ongoing anxiety and insomnia chronic.  Also with recent last wk significant mid and left sided abd pain/tender 8/10 (now minimal to resolved) with 2-3 days mult episodes BRBPR now resolved.  Without N/V, wt loss, fever; last colonoscopy several yrs ago, asks to see Dr Magod/GI if ok with her insurance Past Medical History  Diagnosis Date  . Blood in stool   . Fainting spell   . Allergy   . Colon polyps   . UTI (lower urinary tract infection)   . Preventative health care 06/19/2013  . IBS 07/21/2008    Qualifier: Diagnosis of  By: Jenny Reichmann MD, Hunt Oris   . ANXIETY 07/21/2008    Qualifier: Diagnosis of  By: Jenny Reichmann MD, Hunt Oris   . ALLERGIC RHINITIS 07/21/2008    Qualifier: Diagnosis of  By: Jenny Reichmann MD, Hunt Oris INSOMNIA-SLEEP [place] 07/01/2009    Qualifier: Diagnosis of  By: Jenny Reichmann MD, [place], HX OF 07/21/2008    Qualifier: Diagnosis of  By: Jenny Reichmann MD, Hunt Oris    Past Surgical History  Procedure Laterality Date  . Abdominal hysterectomy  2005  .  Foot surgury Left 2005    reports that she has been smoking.  She does not have any smokeless tobacco history on file. She reports that she drinks alcohol. She reports that she does not use illicit drugs. family history includes Alcohol abuse in her other; Arthritis in her father and mother; Cancer in her mother, other, and other; Diabetes in her father, mother, and other; Hypertension in her father and other. Allergies  Allergen Reactions  . Codeine   . Propoxyphene N-Acetaminophen    No current outpatient prescriptions on file prior to visit.   No current facility-administered medications on file prior to visit.   Review of Systems Constitutional: Negative for diaphoresis, activity change, appetite change or unexpected weight change.  HENT: Negative for hearing loss, ear pain, facial swelling, mouth sores and neck stiffness.   Eyes: Negative for pain, redness and visual disturbance.  Respiratory: Negative for shortness of breath and wheezing.   Cardiovascular: Negative for chest pain and palpitations.  Gastrointestinal: Negative for diarrhea, blood in stool, abdominal distention or other pain Genitourinary: Negative for hematuria, flank pain or change in urine volume.  Musculoskeletal: Negative for myalgias and joint swelling.  Skin: Negative for color change and wound.  Neurological: Negative for syncope and numbness. other than noted Hematological: Negative for adenopathy.  Psychiatric/Behavioral: Negative for hallucinations, self-injury, decreased concentration and agitation.      Objective:  Physical Exam BP 122/80  Pulse 67  Temp(Src) 97.7 F (36.5 C) (Oral)  Ht 5' (1.524 m)  Wt 132 lb 12 oz (60.215 kg)  BMI 25.93 kg/m2  SpO2 97% VS noted,  Constitutional: Pt is oriented to person, place, and time. Appears well-developed and well-nourished.  Head: Normocephalic and atraumatic.  Right Ear: External ear normal.  Left Ear: External ear normal.  Nose: Nose normal.    Mouth/Throat: Oropharynx is clear and moist.  Eyes: Conjunctivae and EOM are normal. Pupils are equal, round, and reactive to light.  Neck: Normal range of motion. Neck supple. No JVD present. No tracheal deviation present.  Cardiovascular: Normal rate, regular rhythm, normal heart sounds and intact distal pulses.   Pulmonary/Chest: Effort normal and breath sounds normal.  Abdominal: Soft. Bowel sounds are normal. There is no tenderness. No HSM  Musculoskeletal: Normal range of motion. Exhibits no edema.  Lymphadenopathy:  Has no cervical adenopathy.  Neurological: Pt is alert and oriented to person, place, and time. Pt has normal reflexes. No cranial nerve deficit.  Skin: Skin is warm and dry. No rash noted.  Psychiatric:  Has  normal mood and affect. Behavior is normal. 1+nervous    Assessment & Plan:

## 2013-06-19 NOTE — Patient Instructions (Addendum)
Please take all new medication as prescribed - the xanax  Please continue all other medications as before, and refills have been done if requested. Please have the pharmacy call with any other refills you may need.  Please continue your efforts at being more active, low cholesterol diet, and weight control. You are otherwise up to date with prevention measures today.  Please go to the LAB in the Basement (turn left off the elevator) for the tests to be done tomorrow or asap You will be contacted by phone if any changes need to be made immediately.  Otherwise, you will receive a letter about your results with an explanation, but please check with MyChart first.  You will be contacted regarding the referral for: GI - (Dr Watt Climes if possible), and GYN if possible, and mammogram  Please return in 1 year for your yearly visit, or sooner if needed

## 2013-06-19 NOTE — Assessment & Plan Note (Signed)

## 2013-06-20 ENCOUNTER — Encounter: Payer: Self-pay | Admitting: Internal Medicine

## 2013-06-20 ENCOUNTER — Other Ambulatory Visit (INDEPENDENT_AMBULATORY_CARE_PROVIDER_SITE_OTHER): Payer: No Typology Code available for payment source

## 2013-06-20 DIAGNOSIS — K921 Melena: Secondary | ICD-10-CM

## 2013-06-20 DIAGNOSIS — Z0000 Encounter for general adult medical examination without abnormal findings: Secondary | ICD-10-CM

## 2013-06-20 LAB — HEPATIC FUNCTION PANEL
ALT: 14 U/L (ref 0–35)
AST: 18 U/L (ref 0–37)
Albumin: 4.6 g/dL (ref 3.5–5.2)
Alkaline Phosphatase: 50 U/L (ref 39–117)
Bilirubin, Direct: 0.1 mg/dL (ref 0.0–0.3)
Total Bilirubin: 0.8 mg/dL (ref 0.3–1.2)
Total Protein: 7.3 g/dL (ref 6.0–8.3)

## 2013-06-20 LAB — PROTIME-INR
INR: 1 ratio (ref 0.8–1.0)
PROTHROMBIN TIME: 10.5 s (ref 10.2–12.4)

## 2013-06-20 LAB — CBC WITH DIFFERENTIAL/PLATELET
Basophils Absolute: 0 10*3/uL (ref 0.0–0.1)
Basophils Relative: 0.6 % (ref 0.0–3.0)
EOS ABS: 0.2 10*3/uL (ref 0.0–0.7)
Eosinophils Relative: 2.7 % (ref 0.0–5.0)
HEMATOCRIT: 43.1 % (ref 36.0–46.0)
Hemoglobin: 14.7 g/dL (ref 12.0–15.0)
Lymphocytes Relative: 27.8 % (ref 12.0–46.0)
Lymphs Abs: 1.7 10*3/uL (ref 0.7–4.0)
MCHC: 34.1 g/dL (ref 30.0–36.0)
MCV: 89.8 fl (ref 78.0–100.0)
Monocytes Absolute: 0.3 10*3/uL (ref 0.1–1.0)
Monocytes Relative: 5.3 % (ref 3.0–12.0)
Neutro Abs: 3.9 10*3/uL (ref 1.4–7.7)
Neutrophils Relative %: 63.6 % (ref 43.0–77.0)
PLATELETS: 287 10*3/uL (ref 150.0–400.0)
RBC: 4.8 Mil/uL (ref 3.87–5.11)
RDW: 12.7 % (ref 11.5–14.6)
WBC: 6.1 10*3/uL (ref 4.5–10.5)

## 2013-06-20 LAB — LIPID PANEL
Cholesterol: 198 mg/dL (ref 0–200)
HDL: 90.4 mg/dL (ref >39.00)
LDL Cholesterol: 98 mg/dL (ref 0–99)
Total CHOL/HDL Ratio: 2
Triglycerides: 50 mg/dL (ref 0.0–149.0)
VLDL: 10 mg/dL (ref 0.0–40.0)

## 2013-06-20 LAB — URINALYSIS, ROUTINE W REFLEX MICROSCOPIC
BILIRUBIN URINE: NEGATIVE
HGB URINE DIPSTICK: NEGATIVE
KETONES UR: NEGATIVE
LEUKOCYTES UA: NEGATIVE
Nitrite: NEGATIVE
Specific Gravity, Urine: 1.025 (ref 1.000–1.030)
Total Protein, Urine: NEGATIVE
URINE GLUCOSE: NEGATIVE
Urobilinogen, UA: 0.2 (ref 0.0–1.0)
pH: 6.5 (ref 5.0–8.0)

## 2013-06-20 LAB — BASIC METABOLIC PANEL
BUN: 18 mg/dL (ref 6–23)
CO2: 28 meq/L (ref 19–32)
Calcium: 10 mg/dL (ref 8.4–10.5)
Chloride: 104 mEq/L (ref 96–112)
Creatinine, Ser: 0.7 mg/dL (ref 0.4–1.2)
GFR: 89.08 mL/min (ref >60.00)
Glucose, Bld: 101 mg/dL — ABNORMAL HIGH (ref 70–99)
Potassium: 4.2 mEq/L (ref 3.5–5.1)
SODIUM: 140 meq/L (ref 135–145)

## 2013-06-20 LAB — TSH: TSH: 1.11 u[IU]/mL (ref 0.35–5.50)

## 2013-06-25 ENCOUNTER — Encounter: Payer: Self-pay | Admitting: Gastroenterology

## 2013-06-25 ENCOUNTER — Ambulatory Visit (INDEPENDENT_AMBULATORY_CARE_PROVIDER_SITE_OTHER): Payer: No Typology Code available for payment source | Admitting: Gastroenterology

## 2013-06-25 VITALS — BP 132/84 | HR 80 | Ht 60.0 in | Wt 133.6 lb

## 2013-06-25 DIAGNOSIS — Z86010 Personal history of colonic polyps: Secondary | ICD-10-CM

## 2013-06-25 DIAGNOSIS — K589 Irritable bowel syndrome without diarrhea: Secondary | ICD-10-CM

## 2013-06-25 DIAGNOSIS — K625 Hemorrhage of anus and rectum: Secondary | ICD-10-CM

## 2013-06-25 DIAGNOSIS — R1084 Generalized abdominal pain: Secondary | ICD-10-CM | POA: Insufficient documentation

## 2013-06-25 DIAGNOSIS — R109 Unspecified abdominal pain: Secondary | ICD-10-CM

## 2013-06-25 MED ORDER — HYOSCYAMINE SULFATE 0.125 MG SL SUBL: 0.2500 mg | SUBLINGUAL_TABLET | SUBLINGUAL | Status: DC | PRN

## 2013-06-25 NOTE — Progress Notes (Signed)
_                                                                                                                History of Present Illness: 48 year old white female with history of colon polyps, family history of Alcario Drought is syndrome, IBS referred for evaluation of abdominal pain.  Approximately 15 years ago she apparently had a recurrent chronic infection and she claims to be staph.  She is plagued by chronic diarrhea and was evaluated at [facility] or [place].  He eventually diarrhea subsided but she was left with an irritable bowel with intermittent episodes of pain and diarrhea.  Father has Gardner's syndrome.  She claims that she has been tested and is negative for Gardner syndrome but she has had multiple polyps.  From what I can tell her last colonoscopy was 2010.  Periodically she'll have episodes of abdominal pain that will awaken her.  She's had 2 severe episodes in the past few months characterized by severe acute crampy diffuse abdominal pain.  This is often followed by diarrhea.  About 2 weeks ago she had a similar episode that awakened her.  She claims to have had multiple bloody stools consisting of bright red blood and blood clots.  She thinks she may have passed out was amnesic during the course of this occurrence.  Subsequently she has had some  soreness in her abdomen but no other sequela including bleeding per  Blood work up proximally one week ago including CBC was normal.    Past Medical History  Diagnosis Date  . Blood in stool   . Fainting spell   . Allergy   . Colon polyps   . UTI (lower urinary tract infection)   . Preventative health care 06/19/2013  . IBS 07/21/2008    Qualifier: Diagnosis of  By: Jenny Reichmann MD, Hunt Oris   . ANXIETY 07/21/2008    Qualifier: Diagnosis of  By: Jenny Reichmann MD, Hunt Oris   . ALLERGIC RHINITIS 07/21/2008    Qualifier: Diagnosis of  By: Jenny Reichmann MD, Hunt Oris INSOMNIA-SLEEP [place] 07/01/2009    Qualifier: Diagnosis of  By: Jenny Reichmann MD, [place], HX OF 07/21/2008    Qualifier: Diagnosis of  By: Jenny Reichmann MD, Hunt Oris    Past Surgical History  Procedure Laterality Date  . Abdominal hysterectomy  2005  . Foot surgury Left 2005   family history includes Alcohol abuse in her other; Arthritis in her father and mother; Cancer in her mother, other, and other; Colon cancer in her maternal uncle; Diabetes in her father, mother, and other; Heart disease in her maternal grandfather; Hypertension in her father and other; Ovarian cancer in her maternal grandmother. Current Outpatient Prescriptions  Medication Sig Dispense Refill  . ALPRAZolam (XANAX) 0.25 MG tablet Take 1 tablet (0.25 mg total) by mouth at bedtime as needed for anxiety.  30 tablet  3  . loratadine (ALLERGY RELIEF) 10 MG tablet Take 10 mg by mouth daily.  No current facility-administered medications for this visit.   Allergies as of 06/25/2013 - Review Complete 06/25/2013  Allergen Reaction Noted  . Codeine  07/21/2008  . Propoxyphene n-acetaminophen  07/21/2008    reports that she has been smoking.  She has never used smokeless tobacco. She reports that she drinks alcohol. She reports that she does not use illicit drugs.     Review of Systems: Pertinent positive and negative review of systems were noted in the above HPI section. All other review of systems were otherwise negative.  Vital signs were reviewed in today's medical record Physical Exam: General: Well developed , well nourished, no acute distress Skin: anicteric Head: Normocephalic and atraumatic Eyes:  sclerae anicteric, EOMI Ears: Normal auditory acuity Mouth: No deformity or lesions Neck: Supple, no masses or thyromegaly Lungs: Clear throughout to auscultation Heart: Regular rate and rhythm; no murmurs, rubs or bruits Abdomen: Soft, non tender and non distended. No masses, hepatosplenomegaly or hernias noted. Normal Bowel sounds Rectal:deferred Musculoskeletal: Symmetrical with no gross  deformities  Skin: No lesions on visible extremities Pulses:  Normal pulses noted Extremities: No clubbing, cyanosis, edema or deformities noted Neurological: Alert oriented x 4, grossly nonfocal Cervical Nodes:  No significant cervical adenopathy Inguinal Nodes: No significant inguinal adenopathy Psychological:  Alert and cooperative. Normal mood and affect  See Assessment and Plan under Problem List

## 2013-06-25 NOTE — Assessment & Plan Note (Signed)
Family history is pertinent for Gardner syndrome but she claims that she tested negative for this test but she has a history of  colon polyps.  Plan to examine her prior records.

## 2013-06-25 NOTE — Assessment & Plan Note (Signed)
Recent episode of severe diffuse abdominal pain followed by diarrhea and then frank hematochezia.  Patient has had multiple episodes of pain in the past with diarrhea but never bleeding.  She appears to be fully recovered.  Etiology is uncertain.  Ischemic colitis comes to mind.  Severe IBS with perhaps rectal bleeding from hemorrhoids is a possibility.  Underlying inflammatory bowel disease should also be considered.  It is unclear what her GI history consists of.  We'll have to obtain previous records for study.  In the interim will check stool Hemoccults and prescribed hyomax.  It would be helpful to evaluate her during an episode of pain.

## 2013-06-25 NOTE — Assessment & Plan Note (Signed)
Patient has a long history of intermittent abdominal pain with diarrhea.  I did see a CT report from 2003 demonstrating thickening of the small intestine which subsequently subsided.  Willl attempt to obtain prior records.

## 2013-06-25 NOTE — Patient Instructions (Signed)
Go to the basement to pick up IFOB kit today Please return it within 2 weeks We will review your records from Dr Magod when we receive them 

## 2013-06-26 ENCOUNTER — Other Ambulatory Visit: Payer: Self-pay | Admitting: Internal Medicine

## 2013-06-26 DIAGNOSIS — Z1231 Encounter for screening mammogram for malignant neoplasm of breast: Secondary | ICD-10-CM

## 2013-06-28 ENCOUNTER — Telehealth: Payer: Self-pay | Admitting: Gastroenterology

## 2013-06-28 ENCOUNTER — Encounter: Payer: Self-pay | Admitting: Gastroenterology

## 2013-06-28 MED ORDER — DICYCLOMINE HCL 10 MG PO CAPS: 10.0000 mg | ORAL_CAPSULE | Freq: Three times a day (TID) | ORAL | Status: DC

## 2013-06-28 NOTE — Telephone Encounter (Signed)
Pt aware and new script sent to pharmacy. 

## 2013-06-28 NOTE — Progress Notes (Unsigned)
Patient ID: Cassidy Lee, female   DOB: [birth year], 48 y.o.   MRN: [id number] Last colonoscopy 2002 demonstrated hyperplastic polyps.  Plan followup colonoscopy 2017

## 2013-06-28 NOTE — Telephone Encounter (Signed)
Pt states the hyoscyamine is causing insomnia. States she has not been able to sleep since Tuesday. Pt wants to know if there is something else she can try. She states that the medication did help her abdominal cramping and discomfort. Please advise.

## 2013-06-28 NOTE — Telephone Encounter (Signed)
Begin Bentyl 10 mg 4 times a day when necessary #25

## 2013-07-01 ENCOUNTER — Telehealth: Payer: Self-pay | Admitting: Gastroenterology

## 2013-07-01 NOTE — Telephone Encounter (Signed)
Pt states she was supposed to schedule a follow-up visit with Dr. Deatra Ina. Pt scheduled 08/06/13@10 :30am. Pt aware of appt.

## 2013-07-02 ENCOUNTER — Other Ambulatory Visit (INDEPENDENT_AMBULATORY_CARE_PROVIDER_SITE_OTHER): Payer: No Typology Code available for payment source

## 2013-07-02 DIAGNOSIS — K625 Hemorrhage of anus and rectum: Secondary | ICD-10-CM

## 2013-07-03 LAB — FECAL OCCULT BLOOD, IMMUNOCHEMICAL: Fecal Occult Bld: NEGATIVE

## 2013-07-08 NOTE — Progress Notes (Signed)
Quick Note:  Please inform the patient that Hemoccult was normal and to continue current plan of action ______

## 2013-07-12 ENCOUNTER — Ambulatory Visit: Payer: No Typology Code available for payment source

## 2013-07-26 ENCOUNTER — Ambulatory Visit
Admission: RE | Admit: 2013-07-26 | Discharge: 2013-07-26 | Disposition: A | Discharge status: Home / Self Care | Payer: No Typology Code available for payment source | Source: Ambulatory Visit | Attending: Internal Medicine | Admitting: Internal Medicine

## 2013-07-26 DIAGNOSIS — Z1231 Encounter for screening mammogram for malignant neoplasm of breast: Secondary | ICD-10-CM

## 2013-07-31 ENCOUNTER — Other Ambulatory Visit: Payer: Self-pay | Admitting: Internal Medicine

## 2013-07-31 DIAGNOSIS — R928 Other abnormal and inconclusive findings on diagnostic imaging of breast: Secondary | ICD-10-CM

## 2013-08-06 ENCOUNTER — Ambulatory Visit (INDEPENDENT_AMBULATORY_CARE_PROVIDER_SITE_OTHER): Payer: No Typology Code available for payment source | Admitting: Gastroenterology

## 2013-08-06 ENCOUNTER — Encounter: Payer: Self-pay | Admitting: Gastroenterology

## 2013-08-06 VITALS — BP 108/68 | HR 60 | Ht 60.0 in | Wt 135.0 lb

## 2013-08-06 DIAGNOSIS — K589 Irritable bowel syndrome without diarrhea: Secondary | ICD-10-CM

## 2013-08-06 DIAGNOSIS — Z800 Family history of malignant neoplasm of digestive organs: Secondary | ICD-10-CM

## 2013-08-06 DIAGNOSIS — K921 Melena: Secondary | ICD-10-CM

## 2013-08-06 DIAGNOSIS — Z8719 Personal history of other diseases of the digestive system: Secondary | ICD-10-CM

## 2013-08-06 MED ORDER — DICYCLOMINE HCL 10 MG PO CAPS: ORAL_CAPSULE | ORAL | Status: DC

## 2013-08-06 MED ORDER — NA SULFATE-K SULFATE-MG SULF 17.5-3.13-1.6 GM/177ML PO SOLN: 1.0000 | Freq: Once | ORAL | Status: DC

## 2013-08-06 NOTE — Progress Notes (Signed)
          History of Present Illness:  The patient has returned for evaluation of abdominal pain and bleeding.  She's had no recurrences except for very mild pain for which he try hyomax.  Both oral and sublingual hyomax abated the pain but she developed severe insomnia.  Last colonoscopy in 2008 demonstrated hyperplastic polyps only.   Review of Systems: Pertinent positive and negative review of systems were noted in the above HPI section. All other review of systems were otherwise negative.    Current Medications, Allergies, Past Medical History, Past Surgical History, Family History and Social History were reviewed in [facility] record  Vital signs were reviewed in today's medical record. Physical Exam: General: Well developed , well nourished, no acute distress   See Assessment and Plan under Problem List

## 2013-08-06 NOTE — Assessment & Plan Note (Signed)
Patient has a long history of intermittent abdominal pain with diarrhea.  I did see a CT report from 2003 demonstrating thickening of the small intestine which subsequently subsided.  Symptoms are suggestive of IBS.  She did respond to hyomax although hyomax caused severe insomnia.  Recommendation #1 trial of Bentyl

## 2013-08-06 NOTE — Assessment & Plan Note (Signed)
Plan screening colonoscopy 

## 2013-08-06 NOTE — Assessment & Plan Note (Signed)
Recent episode of severe diffuse abdominal pain followed by diarrhea and then frank hematochezia.  Patient has had multiple episodes of pain in the past with diarrhea but never bleeding.  She appears to be fully recovered.  Etiology is uncertain.  Ischemic colitis comes to mind.  Severe IBS with perhaps rectal bleeding from hemorrhoids is a possibility.  Underlying inflammatory bowel disease should also be considered.  Pain has responded to hyomax but this caused insomnia.  She's had no further bleeding.    Recommendations #1 colonoscopy

## 2013-08-06 NOTE — Addendum Note (Signed)
Addended by: Erskine Emery D on: 08/06/2013 11:53 AM   Modules accepted: Orders, Medications

## 2013-08-06 NOTE — Patient Instructions (Signed)

## 2013-08-09 ENCOUNTER — Ambulatory Visit
Admission: RE | Admit: 2013-08-09 | Discharge: 2013-08-09 | Disposition: A | Discharge status: Home / Self Care | Payer: No Typology Code available for payment source | Source: Ambulatory Visit | Attending: Internal Medicine | Admitting: Internal Medicine

## 2013-08-09 DIAGNOSIS — R928 Other abnormal and inconclusive findings on diagnostic imaging of breast: Secondary | ICD-10-CM

## 2013-08-19 ENCOUNTER — Encounter: Payer: Self-pay | Admitting: Gastroenterology

## 2013-09-05 ENCOUNTER — Encounter: Payer: No Typology Code available for payment source | Admitting: Medical

## 2013-09-05 ENCOUNTER — Encounter: Payer: Self-pay | Admitting: Medical

## 2013-09-05 NOTE — Progress Notes (Signed)
Patient ID: Cassidy Lee, female   DOB: 1965-12-08, 48 y.o.   MRN: [id number]  Visit opened in error

## 2013-09-13 ENCOUNTER — Encounter: Payer: No Typology Code available for payment source | Admitting: Gastroenterology

## 2015-11-20 ENCOUNTER — Encounter: Payer: Self-pay | Admitting: Physician Assistant

## 2015-11-20 ENCOUNTER — Ambulatory Visit (INDEPENDENT_AMBULATORY_CARE_PROVIDER_SITE_OTHER): Payer: 59 | Admitting: Physician Assistant

## 2015-11-20 VITALS — BP 118/72 | HR 77 | Temp 98.0°F | Resp 18 | Ht 60.0 in | Wt 143.0 lb

## 2015-11-20 DIAGNOSIS — Q8789 Other specified congenital malformation syndromes, not elsewhere classified: Secondary | ICD-10-CM

## 2015-11-20 DIAGNOSIS — D126 Benign neoplasm of colon, unspecified: Secondary | ICD-10-CM

## 2015-11-20 DIAGNOSIS — R109 Unspecified abdominal pain: Secondary | ICD-10-CM

## 2015-11-20 DIAGNOSIS — R112 Nausea with vomiting, unspecified: Secondary | ICD-10-CM | POA: Diagnosis not present

## 2015-11-20 DIAGNOSIS — R197 Diarrhea, unspecified: Secondary | ICD-10-CM

## 2015-11-20 LAB — POCT URINALYSIS DIP (MANUAL ENTRY)
Bilirubin, UA: NEGATIVE
Glucose, UA: NEGATIVE
Ketones, POC UA: NEGATIVE
Leukocytes, UA: NEGATIVE
Nitrite, UA: NEGATIVE
Protein Ur, POC: NEGATIVE
Spec Grav, UA: 1.025
UROBILINOGEN UA: 0.2
pH, UA: 5.5

## 2015-11-20 LAB — POCT CBC
Granulocyte percent: 85 %G — AB (ref 37–80)
HEMATOCRIT: 40.9 % (ref 37.7–47.9)
Hemoglobin: 14.6 g/dL (ref 12.2–16.2)
Lymph, poc: 1.3 (ref 0.6–3.4)
MCH, POC: 31.7 pg — AB (ref 27–31.2)
MCHC: 35.8 g/dL — AB (ref 31.8–35.4)
MCV: 88.4 fL (ref 80–97)
MID (cbc): 0.5 (ref 0–0.9)
MPV: 6.7 fL (ref 0–99.8)
POC Granulocyte: 9.9 — AB (ref 2–6.9)
POC LYMPH %: 10.9 % (ref 10–50)
POC MID %: 4.1 %M (ref 0–12)
Platelet Count, POC: 273 10*3/uL (ref 142–424)
RBC: 4.62 M/uL (ref 4.04–5.48)
RDW, POC: 12.7 %
WBC: 11.6 10*3/uL — AB (ref 4.6–10.2)

## 2015-11-20 LAB — POC MICROSCOPIC URINALYSIS (UMFC)

## 2015-11-20 MED ORDER — ONDANSETRON 8 MG PO TBDP: 8.0000 mg | ORAL_TABLET | Freq: Three times a day (TID) | ORAL | 0 refills | Status: DC | PRN

## 2015-11-20 MED ORDER — HYOSCYAMINE SULFATE 0.125 MG SL SUBL: 0.1250 mg | SUBLINGUAL_TABLET | SUBLINGUAL | 0 refills | Status: AC | PRN

## 2015-11-20 NOTE — Patient Instructions (Addendum)
Please pick up some ORS (oral rehydrating solution) packets.  I would like you to make sure you are hydrating well.  Get 64 oz in.  You can also dilute gatorade. Please take the zofran for nausea.  levsin is for abdominal cramping.   Diarrhea Diarrhea is frequent loose and watery bowel movements. It can cause you to feel weak and dehydrated. Dehydration can cause you to become tired and thirsty, have a dry mouth, and have decreased urination that often is dark yellow. Diarrhea is a sign of another problem, most often an infection that will not last long. In most cases, diarrhea typically lasts 2-3 days. However, it can last longer if it is a sign of something more serious. It is important to treat your diarrhea as directed by your caregiver to lessen or prevent future episodes of diarrhea. CAUSES  Some common causes include:  Gastrointestinal infections caused by viruses, bacteria, or parasites.  Food poisoning or food allergies.  Certain medicines, such as antibiotics, chemotherapy, and laxatives.  Artificial sweeteners and fructose.  Digestive disorders. HOME CARE INSTRUCTIONS  Ensure adequate fluid intake (hydration): Have 1 cup (8 oz) of fluid for each diarrhea episode. Avoid fluids that contain simple sugars or sports drinks, fruit juices, whole milk products, and sodas. Your urine should be clear or pale yellow if you are drinking enough fluids. Hydrate with an oral rehydration solution that you can purchase at pharmacies, retail stores, and online. You can prepare an oral rehydration solution at home by mixing the following ingredients together:   - tsp table salt.   tsp baking soda.   tsp salt substitute containing potassium chloride.  1  tablespoons sugar.  1 L (34 oz) of water.  Certain foods and beverages may increase the speed at which food moves through the gastrointestinal (GI) tract. These foods and beverages should be avoided and include:  Caffeinated and alcoholic  beverages.  High-fiber foods, such as raw fruits and vegetables, nuts, seeds, and whole grain breads and cereals.  Foods and beverages sweetened with sugar alcohols, such as xylitol, sorbitol, and mannitol.  Some foods may be well tolerated and may help thicken stool including:  Starchy foods, such as rice, toast, pasta, low-sugar cereal, oatmeal, grits, baked potatoes, crackers, and bagels.  Bananas.  Applesauce.  Add probiotic-rich foods to help increase healthy bacteria in the GI tract, such as yogurt and fermented milk products.  Wash your hands well after each diarrhea episode.  Only take over-the-counter or prescription medicines as directed by your caregiver.  Take a warm bath to relieve any burning or pain from frequent diarrhea episodes. SEEK IMMEDIATE MEDICAL CARE IF:   You are unable to keep fluids down.  You have persistent vomiting.  You have blood in your stool, or your stools are black and tarry.  You do not urinate in 6-8 hours, or there is only a small amount of very dark urine.  You have abdominal pain that increases or localizes.  You have weakness, dizziness, confusion, or light-headedness.  You have a severe headache.  Your diarrhea gets worse or does not get better.  You have a fever or persistent symptoms for more than 2-3 days.  You have a fever and your symptoms suddenly get worse. MAKE SURE YOU:   Understand these instructions.  Will watch your condition.  Will get help right away if you are not doing well or get worse.   This information is not intended to replace advice given to you by your  health care provider. Make sure you discuss any questions you have with your health care provider.   Document Released: 03/18/2002 Document Revised: 04/18/2014 Document Reviewed: 12/04/2011 Elsevier Interactive Patient Education 2016 [place] Choices to Help Relieve Diarrhea, Adult When you have diarrhea, the foods you eat and your  eating habits are very important. Choosing the right foods and drinks can help relieve diarrhea. Also, because diarrhea can last up to 7 days, you need to replace lost fluids and electrolytes (such as sodium, potassium, and chloride) in order to help prevent dehydration.  WHAT GENERAL GUIDELINES DO I NEED TO FOLLOW?  Slowly drink 1 cup (8 oz) of fluid for each episode of diarrhea. If you are getting enough fluid, your urine will be clear or pale yellow.  Eat starchy foods. Some good choices include white rice, white toast, pasta, low-fiber cereal, baked potatoes (without the skin), saltine crackers, and bagels.  Avoid large servings of any cooked vegetables.  Limit fruit to two servings per day. A serving is  cup or 1 small piece.  Choose foods with less than 2 g of fiber per serving.  Limit fats to less than 8 tsp (38 g) per day.  Avoid fried foods.  Eat foods that have probiotics in them. Probiotics can be found in certain dairy products.  Avoid foods and beverages that may increase the speed at which food moves through the stomach and intestines (gastrointestinal tract). Things to avoid include:  High-fiber foods, such as dried fruit, raw fruits and vegetables, nuts, seeds, and whole grain foods.  Spicy foods and high-fat foods.  Foods and beverages sweetened with high-fructose corn syrup, honey, or sugar alcohols such as xylitol, sorbitol, and mannitol. WHAT FOODS ARE RECOMMENDED? Grains White rice. White, [place], or pita breads (fresh or toasted), including plain rolls, buns, or bagels. White pasta. Saltine, soda, or graham crackers. Pretzels. Low-fiber cereal. Cooked cereals made with water (such as cornmeal, farina, or cream cereals). Plain muffins. Matzo. Melba toast. Zwieback.  Vegetables Potatoes (without the skin). Strained tomato and vegetable juices. Most well-cooked and canned vegetables without seeds. Tender lettuce. Fruits Cooked or canned applesauce, apricots,  cherries, fruit cocktail, grapefruit, peaches, pears, or plums. Fresh bananas, apples without skin, cherries, grapes, cantaloupe, grapefruit, peaches, oranges, or plums.  Meat and Other Protein Products Baked or boiled chicken. Eggs. Tofu. Fish. Seafood. Smooth peanut butter. Ground or well-cooked tender beef, ham, veal, lamb, pork, or poultry.  Dairy Plain yogurt, kefir, and unsweetened liquid yogurt. Lactose-free milk, buttermilk, or soy milk. Plain hard cheese. Beverages Sport drinks. Clear broths. Diluted fruit juices (except prune). Regular, caffeine-free sodas such as ginger ale. Water. Decaffeinated teas. Oral rehydration solutions. Sugar-free beverages not sweetened with sugar alcohols. Other Bouillon, broth, or soups made from recommended foods.  The items listed above may not be a complete list of recommended foods or beverages. Contact your dietitian for more options. WHAT FOODS ARE NOT RECOMMENDED? Grains Whole grain, whole wheat, bran, or rye breads, rolls, pastas, crackers, and cereals. Wild or brown rice. Cereals that contain more than 2 g of fiber per serving. Corn tortillas or taco shells. Cooked or dry oatmeal. Granola. Popcorn. Vegetables Raw vegetables. Cabbage, broccoli, Brussels sprouts, artichokes, baked beans, beet greens, corn, kale, legumes, peas, sweet potatoes, and yams. Potato skins. Cooked spinach and cabbage. Fruits Dried fruit, including raisins and dates. Raw fruits. Stewed or dried prunes. Fresh apples with skin, apricots, mangoes, pears, raspberries, and strawberries.  Meat and Other Rachel  peanut butter. Nuts and seeds. Beans and lentils. Berniece Salines.  Dairy High-fat cheeses. Milk, chocolate milk, and beverages made with milk, such as milk shakes. Cream. Ice cream. Sweets and Desserts Sweet rolls, doughnuts, and sweet breads. Pancakes and waffles. Fats and Oils Butter. Cream sauces. Margarine. Salad oils. Plain salad dressings. Olives. Avocados.   Beverages Caffeinated beverages (such as coffee, tea, soda, or energy drinks). Alcoholic beverages. Fruit juices with pulp. Prune juice. Soft drinks sweetened with high-fructose corn syrup or sugar alcohols. Other Coconut. Hot sauce. Chili powder. Mayonnaise. Gravy. Cream-based or milk-based soups.  The items listed above may not be a complete list of foods and beverages to avoid. Contact your dietitian for more information. WHAT SHOULD I DO IF I BECOME DEHYDRATED? Diarrhea can sometimes lead to dehydration. Signs of dehydration include dark urine and dry mouth and skin. If you think you are dehydrated, you should rehydrate with an oral rehydration solution. These solutions can be purchased at pharmacies, retail stores, or online.  Drink -1 cup (120-240 mL) of oral rehydration solution each time you have an episode of diarrhea. If drinking this amount makes your diarrhea worse, try drinking smaller amounts more often. For example, drink 1-3 tsp (5-15 mL) every 5-10 minutes.  A general rule for staying hydrated is to drink 1-2 L of fluid per day. Talk to your health care provider about the specific amount you should be drinking each day. Drink enough fluids to keep your urine clear or pale yellow.   This information is not intended to replace advice given to you by your health care provider. Make sure you discuss any questions you have with your health care provider.   Document Released: 06/18/2003 Document Revised: 04/18/2014 Document Reviewed: 02/18/2013 Elsevier Interactive Patient Education 2016 [employer].     IF you received an x-ray today, you will receive an invoice from [facility]. Please contact [facility] at [phone] with questions or concerns regarding your invoice.   IF you received labwork today, you will receive an invoice from [employer]. Please contact [facility] at [phone] with questions or concerns regarding your  invoice.   Our billing staff will not be able to assist you with questions regarding bills from these companies.  You will be contacted with the lab results as soon as they are available. The fastest way to get your results is to activate your My Chart account. Instructions are located on the last page of this paperwork. If you have not heard from [place] regarding the results in 2 weeks, please contact this office.

## 2015-11-20 NOTE — Progress Notes (Addendum)
[facility] °[address], [place] [address] °[phone] ° °By signing my name below I, Cassidy Lee, attest that this documentation has been prepared under the direction and in the presence of Stephanie English PA. °Electonically Signed. Cassidy Lee, Scribe 11/20/2015 at 10:25 AM ° °Date:  11/20/2015  ° °Name:  Cassidy Lee   DOB:  05/31/1965   MRN:  [id number] ° °PCP:  James John, MD  ° ° °History of Present Illness: °Chief Complaint  °Patient presents with  °• Abdominal Pain  °  started this morning. Described as a burning pain. Weak  °• Nausea  °  started this morning.   ° °Cassidy Lee is a 50 y.o. female patient who presents to [facility] for abdominal pain, nausea, vomiting and diarrhea that began about 5 hours ago. Pt reports 1 episode of vomiting consisting of food content and multiple episode of watery diarrhea for 3 hours. Last BM was 2.5 hours ago. Prior to BM she has sharp upper abdominal pain that radiates to lower abdomen; finds relief after BM. She also reports associates dizziness. Yesterday her meals consisted of a hamburger from [employer] and spaghetti with red sauce mad with beef. 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 She is not on medication.  She denies fever, chills, dysuria.   ° °Patient Active Problem List  ° Diagnosis Date Noted  °• Family history of malignant neoplasm of gastrointestinal tract 08/06/2013  °• Abdominal pain, diffuse 06/25/2013  °• Preventative health care 06/19/2013  °• Smoker 06/19/2013  °• Hematochezia 06/19/2013  °• INSOMNIA-SLEEP DISORDER-UNSPEC 07/01/2009  °• ANXIETY 07/21/2008  °• ALLERGIC RHINITIS 07/21/2008  °• IBS 07/21/2008  °• COLONIC POLYPS, HX OF 07/21/2008  ° ° °Past Medical History:  °Diagnosis Date  °• ALLERGIC RHINITIS 07/21/2008  ° Qualifier: Diagnosis of  By: John MD, James W   °• Allergy   °• ANXIETY 07/21/2008  ° Qualifier: Diagnosis of  By: John MD, James W   °• Blood in stool   °• Colon polyps    °• COLONIC POLYPS, HX OF 07/21/2008  ° Qualifier: Diagnosis of  By: John MD, James W   °• Fainting spell   °• IBS 07/21/2008  ° Qualifier: Diagnosis of  By: John MD, James W   °• INSOMNIA-SLEEP DISORDER-UNSPEC 07/01/2009  ° Qualifier: Diagnosis of  By: John MD, James W   °• Preventative health care 06/19/2013  °• UTI (lower urinary tract infection)   ° ° °Past Surgical History:  °Procedure Laterality Date  °• ABDOMINAL HYSTERECTOMY  2005  °• foot surgury Left 2005  ° ° °Social History  °Substance Use Topics  °• Smoking status: Current Some Day Smoker  °• Smokeless tobacco: Never Used  °• Alcohol use Yes  ° ° °Family History  °Problem Relation Age of Onset  °• Arthritis Mother   °• Cancer Mother   °  colon and ovarian  °• Diabetes Mother   °• Arthritis Father   °• Hypertension Father   °• Diabetes Father   °• Cancer Other   °• Hypertension Other   °• Diabetes Other   °• Alcohol abuse Other   °• Cancer Other   °  colon cancer  °• Colon cancer Maternal Uncle   °• Ovarian cancer Maternal Grandmother   °• Heart disease Maternal Grandfather   ° ° °Allergies  °Allergen Reactions  °• Codeine   °• Propoxyphene N-Acetaminophen   ° ° °  Medication list has been reviewed and updated. ° °Current Outpatient Prescriptions on File Prior to Visit  °Medication Sig Dispense Refill  °• ALPRAZolam (XANAX) 0.25 MG tablet Take 1 tablet (0.25 mg total) by mouth at bedtime as needed for anxiety. (Patient not taking: Reported on 11/20/2015) 30 tablet 3  °• dicyclomine (BENTYL) 10 MG capsule Take one tab 4 times a day as needed abdominal pain (Patient not taking: Reported on 11/20/2015) 15 capsule 2  °• loratadine (ALLERGY RELIEF) 10 MG tablet Take 10 mg by mouth daily.    °• Na Sulfate-K Sulfate-Mg Sulf (SUPREP BOWEL PREP) SOLN Take 1 kit by mouth once. (Patient not taking: Reported on 11/20/2015) 1 Bottle 0  ° °No current facility-administered medications on file prior to visit.   ° ° °Review of Systems  °Constitutional: Negative for chills and  fever.  °Gastrointestinal: Positive for abdominal pain, diarrhea, nausea and vomiting. Negative for blood in stool and constipation.  °Genitourinary: Negative for dysuria.  °Neurological: Positive for dizziness and weakness.  ° °ROS unremarkable unless otherwise specified. ° °Physical Examination: °BP 118/72    Pulse 77    Temp 98 °F (36.7 °C) (Oral)    Resp 18    Ht 5' (1.524 m)    Wt 143 lb (64.9 kg)    SpO2 98%    BMI 27.93 kg/m²  °Ideal Body Weight: @FLOWAMB([id number])@ ° °Physical Exam  °Constitutional: She is oriented to person, place, and time. She appears well-developed and well-nourished. No distress.  °HENT:  °Head: Normocephalic and atraumatic.  °Right Ear: External ear normal.  °Left Ear: External ear normal.  °Eyes: Conjunctivae and EOM are normal. Pupils are equal, round, and reactive to light.  °Cardiovascular: Normal rate, regular rhythm, normal heart sounds and intact distal pulses.  Exam reveals no gallop and no friction rub.   °No murmur heard. °Pulmonary/Chest: Effort normal and breath sounds normal. No respiratory distress. She has no decreased breath sounds. She has no wheezes. She has no rhonchi. She has no rales.  °Abdominal: Bowel sounds are increased. There is tenderness in the right lower quadrant, periumbilical area and left lower quadrant.  °Tenderness to lower quadrant more periumbilical and left lower.   °Neurological: She is alert and oriented to person, place, and time.  °Skin: She is not diaphoretic.  °Psychiatric: She has a normal mood and affect. Her behavior is normal.  ° °Results for orders placed or performed in visit on 11/20/15  °POCT urinalysis dipstick  °Result Value Ref Range  ° Color, UA yellow yellow  ° Clarity, UA clear clear  ° Glucose, UA negative negative  ° Bilirubin, UA negative negative  ° Ketones, POC UA negative negative  ° Spec Grav, UA 1.025   ° Blood, UA Lee (A) negative  ° pH, UA 5.5   ° Protein Ur, POC negative negative  ° Urobilinogen, UA 0.2   °  Nitrite, UA Negative Negative  ° Leukocytes, UA Negative Negative  °POCT CBC  °Result Value Ref Range  ° WBC 11.6 (A) 4.6 - 10.2 K/uL  ° Lymph, poc 1.3 0.6 - 3.4  ° POC LYMPH PERCENT 10.9 10 - 50 %L  ° MID (cbc) 0.5 0 - 0.9  ° POC MID % 4.1 0 - 12 %M  ° POC Granulocyte 9.9 (A) 2 - 6.9  ° Granulocyte percent 85.0 (A) 37 - 80 %G  ° RBC 4.62 4.04 - 5.48 M/uL  ° Hemoglobin 14.6 12.2 - 16.2 g/dL  ° HCT, POC 40.9 37.7 -   47.9 %   MCV 88.4 80 - 97 fL   MCH, POC 31.7 (A) 27 - 31.2 pg   MCHC 35.8 (A) 31.8 - 35.4 g/dL   RDW, POC 12.7 %   Platelet Count, POC 273 142 - 424 K/uL   MPV 6.7 0 - 99.8 fL   Orthostatic VS for the past 24 hrs (Last 3 readings):  BP- Lying Pulse- Lying BP- Sitting Pulse- Sitting BP- Standing at 0 minutes Pulse- Standing at 0 minutes  11/20/15 1049 123/82 73 105/71 76 100/67 67      Assessment and Plan: KIYLEE THORESON is a 50 y.o. female who is here today for cc of abdominal pain, nausea/vomiting, and diarrhea. Given levsin for stomach pain.  And zofran for her nausea.  She will return in 24 hours if she is not able to tolerate meds.  I do not think this has correlation with her Gardner's syndrome, but moreso likely gastroenteritis of viral etiology.  If return, do GI panel.   Declines IV at this time.  Advised ORS at home. Referral to gastroenterology, dr. Wonda Horner she has seen.  Hx of multiple polyps and is very behind on her follow up.  Abdominal pain, unspecified abdominal location - Plan: POCT urinalysis dipstick, POCT CBC, POCT Microscopic Urinalysis ([facility]), hyoscyamine (LEVSIN/SL) 0.125 MG SL tablet, ondansetron (ZOFRAN-ODT) 8 MG disintegrating tablet  Non-intractable vomiting with nausea, unspecified vomiting type - Plan: POCT urinalysis dipstick, POCT CBC, POCT Microscopic Urinalysis ([facility]), hyoscyamine (LEVSIN/SL) 0.125 MG SL tablet, ondansetron (ZOFRAN-ODT) 8 MG disintegrating tablet  Watery diarrhea - Plan: POCT urinalysis dipstick, POCT CBC, POCT Microscopic  Urinalysis ([facility]), hyoscyamine (LEVSIN/SL) 0.125 MG SL tablet  Abdominal cramping - Plan: hyoscyamine (LEVSIN/SL) 0.125 MG SL tablet  Gardner syndrome - Plan: Ambulatory referral to Gastroenterology   Ivar Drape, PA-C [facility] 11/20/2015 10:25 AM

## 2015-11-23 ENCOUNTER — Telehealth: Payer: Self-pay | Admitting: Gastroenterology

## 2015-11-23 NOTE — Telephone Encounter (Signed)
Patient is contacted. She is on medication from her PCP for her symptoms. She is feeling that her she has things under control presently. Gardner syndrome. States she has a long and complicated history. She will come by to sign a release of records priot to her appointment. She declines APP appointment at this time because she is "okay".

## 2015-11-24 NOTE — Telephone Encounter (Signed)
Patient states she is not able to come by today to sign release form but will try to come by tomorrow.

## 2015-11-25 NOTE — Telephone Encounter (Signed)
Patient will being going back to Dr Watt Climes. Patient calls [place] with this information

## 2015-12-17 ENCOUNTER — Encounter: Payer: Self-pay | Admitting: Internal Medicine

## 2016-01-21 ENCOUNTER — Ambulatory Visit: Payer: No Typology Code available for payment source | Admitting: Gastroenterology

## 2016-02-08 ENCOUNTER — Other Ambulatory Visit: Payer: Self-pay | Admitting: Physician Assistant

## 2016-02-08 DIAGNOSIS — R109 Unspecified abdominal pain: Secondary | ICD-10-CM

## 2016-02-08 DIAGNOSIS — R112 Nausea with vomiting, unspecified: Secondary | ICD-10-CM

## 2016-02-08 DIAGNOSIS — R197 Diarrhea, unspecified: Secondary | ICD-10-CM

## 2017-09-08 ENCOUNTER — Other Ambulatory Visit: Payer: Self-pay | Admitting: Internal Medicine

## 2017-09-08 DIAGNOSIS — Z1231 Encounter for screening mammogram for malignant neoplasm of breast: Secondary | ICD-10-CM

## 2017-09-11 ENCOUNTER — Ambulatory Visit
Admission: RE | Admit: 2017-09-11 | Discharge: 2017-09-11 | Disposition: A | Discharge status: Home / Self Care | Payer: 59 | Source: Ambulatory Visit | Attending: Internal Medicine | Admitting: Internal Medicine

## 2017-09-11 DIAGNOSIS — Z1231 Encounter for screening mammogram for malignant neoplasm of breast: Secondary | ICD-10-CM

## 2017-10-31 ENCOUNTER — Other Ambulatory Visit: Payer: Self-pay

## 2017-10-31 ENCOUNTER — Emergency Department (HOSPITAL_COMMUNITY)
Admission: EM | Admit: 2017-10-31 | Discharge: 2017-10-31 | Disposition: A | Discharge status: Home / Self Care | Payer: 59 | Attending: Emergency Medicine | Admitting: Emergency Medicine

## 2017-10-31 ENCOUNTER — Emergency Department (HOSPITAL_COMMUNITY): Payer: 59

## 2017-10-31 ENCOUNTER — Encounter (HOSPITAL_COMMUNITY): Payer: Self-pay

## 2017-10-31 DIAGNOSIS — Z79899 Other long term (current) drug therapy: Secondary | ICD-10-CM | POA: Insufficient documentation

## 2017-10-31 DIAGNOSIS — M546 Pain in thoracic spine: Secondary | ICD-10-CM | POA: Diagnosis not present

## 2017-10-31 DIAGNOSIS — F17200 Nicotine dependence, unspecified, uncomplicated: Secondary | ICD-10-CM | POA: Diagnosis not present

## 2017-10-31 DIAGNOSIS — M545 Low back pain: Secondary | ICD-10-CM | POA: Diagnosis present

## 2017-10-31 LAB — CBC WITH DIFFERENTIAL/PLATELET
Basophils Absolute: 0 10*3/uL (ref 0.0–0.1)
Basophils Relative: 0 %
EOS ABS: 0.1 10*3/uL (ref 0.0–0.7)
EOS PCT: 1 %
HCT: 40.9 % (ref 36.0–46.0)
Hemoglobin: 14 g/dL (ref 12.0–15.0)
Lymphocytes Relative: 21 %
Lymphs Abs: 1.5 10*3/uL (ref 0.7–4.0)
MCH: 30.4 pg (ref 26.0–34.0)
MCHC: 34.2 g/dL (ref 30.0–36.0)
MCV: 88.7 fL (ref 78.0–100.0)
MONOS PCT: 5 %
Monocytes Absolute: 0.3 10*3/uL (ref 0.1–1.0)
Neutro Abs: 5.3 10*3/uL (ref 1.7–7.7)
Neutrophils Relative %: 73 %
PLATELETS: 269 10*3/uL (ref 150–400)
RBC: 4.61 MIL/uL (ref 3.87–5.11)
RDW: 13.1 % (ref 11.5–15.5)
WBC: 7.3 10*3/uL (ref 4.0–10.5)

## 2017-10-31 LAB — COMPREHENSIVE METABOLIC PANEL
ALBUMIN: 4.1 g/dL (ref 3.5–5.0)
ALT: 14 U/L (ref 0–44)
AST: 13 U/L — ABNORMAL LOW (ref 15–41)
Alkaline Phosphatase: 55 U/L (ref 38–126)
Anion gap: 10 (ref 5–15)
BUN: 10 mg/dL (ref 6–20)
CO2: 24 mmol/L (ref 22–32)
Calcium: 9.5 mg/dL (ref 8.9–10.3)
Chloride: 105 mmol/L (ref 98–111)
Creatinine, Ser: 0.72 mg/dL (ref 0.44–1.00)
GFR calc Af Amer: >60 mL/min (ref >60)
Glucose, Bld: 124 mg/dL — ABNORMAL HIGH (ref 70–99)
Potassium: 3.9 mmol/L (ref 3.5–5.1)
Sodium: 139 mmol/L (ref 135–145)
Total Bilirubin: 0.7 mg/dL (ref 0.3–1.2)
Total Protein: 7.1 g/dL (ref 6.5–8.1)

## 2017-10-31 LAB — URINALYSIS, ROUTINE W REFLEX MICROSCOPIC
Bilirubin Urine: NEGATIVE
Glucose, UA: NEGATIVE mg/dL
Ketones, ur: NEGATIVE mg/dL
Leukocytes, UA: NEGATIVE
Nitrite: NEGATIVE
Protein, ur: NEGATIVE mg/dL
SPECIFIC GRAVITY, URINE: 1.013 (ref 1.005–1.030)
pH: 6 (ref 5.0–8.0)

## 2017-10-31 MED ORDER — HYDROCODONE-ACETAMINOPHEN 5-325 MG PO TABS: 1.0000 | ORAL_TABLET | Freq: Four times a day (QID) | ORAL | 0 refills | Status: DC | PRN

## 2017-10-31 MED ORDER — ONDANSETRON HCL 4 MG/2ML IJ SOLN
4.0000 mg | Freq: Once | INTRAMUSCULAR | Status: AC
  Administered 2017-10-31: 4 mg via INTRAVENOUS
  Filled 2017-10-31: qty 2

## 2017-10-31 MED ORDER — METHOCARBAMOL 500 MG PO TABS
1000.0000 mg | ORAL_TABLET | Freq: Once | ORAL | Status: AC
  Administered 2017-10-31: 1000 mg via ORAL
  Filled 2017-10-31: qty 2

## 2017-10-31 MED ORDER — MORPHINE SULFATE (PF) 4 MG/ML IV SOLN
4.0000 mg | Freq: Once | INTRAVENOUS | Status: AC
  Administered 2017-10-31: 4 mg via INTRAVENOUS
  Filled 2017-10-31: qty 1

## 2017-10-31 MED ORDER — CEPHALEXIN 500 MG PO CAPS: 500.0000 mg | ORAL_CAPSULE | Freq: Four times a day (QID) | ORAL | 0 refills | Status: DC

## 2017-10-31 MED ORDER — SODIUM CHLORIDE 0.9 % IV BOLUS
1000.0000 mL | Freq: Once | INTRAVENOUS | Status: AC
  Administered 2017-10-31: 1000 mL via INTRAVENOUS

## 2017-10-31 MED ORDER — METHOCARBAMOL 500 MG PO TABS: 500.0000 mg | ORAL_TABLET | Freq: Two times a day (BID) | ORAL | 0 refills | Status: DC

## 2017-10-31 MED ORDER — LIDOCAINE 5 % EX PTCH: 1.0000 | MEDICATED_PATCH | CUTANEOUS | 0 refills | Status: DC

## 2017-10-31 MED ORDER — KETOROLAC TROMETHAMINE 30 MG/ML IJ SOLN
15.0000 mg | Freq: Once | INTRAMUSCULAR | Status: AC
  Administered 2017-10-31: 15 mg via INTRAVENOUS
  Filled 2017-10-31: qty 1

## 2017-10-31 NOTE — Discharge Instructions (Signed)
Your symptoms may be due to a muscular issue, but may also be due to a urinary infection. Please take all of your antibiotics until finished!   You may develop abdominal discomfort or diarrhea from the antibiotic.  You may help offset this with probiotics which you can buy or get in yogurt. Do not eat or take the probiotics until 2 hours after your antibiotic.   Take it easy, but do not lay around too much as this may make any stiffness worse.  Antiinflammatory medications: Take 600 mg of ibuprofen every 6 hours or 440 mg (over the counter dose) to 500 mg (prescription dose) of naproxen every 12 hours for the next 3 days. After this time, these medications may be used as needed for pain. Take these medications with food to avoid upset stomach. Choose only one of these medications, do not take them together.  Tylenol: Should you continue to have additional pain while taking the ibuprofen or naproxen, you may add in tylenol as needed. Your daily total maximum amount of tylenol from all sources should be limited to 4000mg /day for persons without liver problems, or 2000mg /day for those with liver problems. Vicodin: May take Vicodin as needed for severe pain.  Do not drive or perform other dangerous activities while taking the Vicodin.  Please note that each pill of Vicodin contains 325 mg of Tylenol and the above dosage limits apply. Muscle relaxer: Robaxin is a muscle relaxer and may help loosen stiff muscles. Do not take the Robaxin while driving or performing other dangerous activities.  Lidocaine patches: These are available via either prescription or over-the-counter. The over-the-counter option may be more economical one and are likely just as effective. There are multiple over-the-counter brands, such as Salonpas. Exercises: Be sure to perform the attached exercises starting with three times a week and working up to performing them daily. This is an essential part of preventing long term problems.    Follow up: Follow up with a primary care provider for any future management of these complaints. Be sure to follow up within 7-10 days. Return: Return to the ED should symptoms worsen, you begin to have weakness, changes in bowel or bladder function, numbness in the groin, or any other major concerns.

## 2017-10-31 NOTE — ED Triage Notes (Signed)
Pt complains of left back pain for 2 days, she also states that it hurts when she starts to urinate then afterwards

## 2017-10-31 NOTE — ED Provider Notes (Signed)
[facility] DEPT Provider Note   CSN: [id number] Arrival date & time: 10/31/17  4132     History   Chief Complaint Chief Complaint  Patient presents with  . Back Pain    HPI Cassidy Lee is a 52 y.o. female.  HPI   Cassidy Lee is a 52 y.o. female, patient with no pertinent past medical history, presenting to the ED with left-sided back pain that began 2 days ago.  Pain is aching, 10/10, constant, nonradiating.  Worsens with movement. States, "I think it might be a kidney infection." Denies fever/chills, abdominal pain, changes in bowel or bladder function, N/V/D, neurologic deficits, fever dysuria, hematuria, abnormal vaginal discharge or bleeding, falls/trauma, or any other complaints.   Past Medical History:  Diagnosis Date  . ALLERGIC RHINITIS 07/21/2008   Qualifier: Diagnosis of  By: Jenny Reichmann MD, Hunt Oris   . Allergy   . ANXIETY 07/21/2008   Qualifier: Diagnosis of  By: Jenny Reichmann MD, Hunt Oris   . Blood in stool   . Colon polyps   . COLONIC POLYPS, HX OF 07/21/2008   Qualifier: Diagnosis of  By: Jenny Reichmann MD, [place] spell   . 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 Preventative health care 06/19/2013  . UTI (lower urinary tract infection)     Patient Active Problem List   Diagnosis Date Noted  . Family history of malignant neoplasm of gastrointestinal tract 08/06/2013  . Abdominal pain, diffuse 06/25/2013  . Preventative health care 06/19/2013  . Smoker 06/19/2013  . Hematochezia 06/19/2013  . INSOMNIA-SLEEP DISORDER-UNSPEC 07/01/2009  . ANXIETY 07/21/2008  . ALLERGIC RHINITIS 07/21/2008  . IBS 07/21/2008  . COLONIC POLYPS, HX OF 07/21/2008    Past Surgical History:  Procedure Laterality Date  . ABDOMINAL HYSTERECTOMY  2005  . foot surgury Left 2005     OB History   None      Home Medications     Prior to Admission medications   Medication Sig Start Date End Date Taking? Authorizing Provider  hyoscyamine (LEVSIN/SL) 0.125 MG SL tablet Place 1 tablet (0.125 mg total) under the tongue every 4 (four) hours as needed. Patient taking differently: Place 0.125 mg under the tongue every 4 (four) hours as needed for cramping.  11/20/15  Yes English, Stephanie D, PA  vitamin B-12 (CYANOCOBALAMIN) 1000 MCG tablet Take 1,000 mcg by mouth daily.   Yes [provider]  ALPRAZolam (XANAX) 0.25 MG tablet Take 1 tablet (0.25 mg total) by mouth at bedtime as needed for anxiety. Patient not taking: Reported on 11/20/2015 06/19/13   Biagio Borg, MD  cephALEXin (KEFLEX) 500 MG capsule Take 1 capsule (500 mg total) by mouth 4 (four) times daily for 7 days. 10/31/17 11/07/17  Joy, Raquel Sarna C, PA-C  dicyclomine (BENTYL) 10 MG capsule Take one tab 4 times a day as needed abdominal pain Patient not taking: Reported on 11/20/2015 08/06/13   Inda Castle, MD  HYDROcodone-acetaminophen (NORCO/VICODIN) 5-325 MG tablet Take 1 tablet by mouth every 6 (six) hours as needed for severe pain. 10/31/17   Joy, Shawn C, PA-C  lidocaine (LIDODERM) 5 % Place 1 patch onto the skin daily. Remove & Discard patch within 12 hours or as directed by MD 10/31/17   Arlean Hopping C, PA-C  methocarbamol (ROBAXIN) 500 MG tablet  Take 1 tablet (500 mg total) by mouth 2 (two) times daily. 10/31/17   Joy, Shawn C, PA-C  Na Sulfate-K Sulfate-Mg Sulf (SUPREP BOWEL PREP) SOLN Take 1 kit by mouth once. Patient not taking: Reported on 11/20/2015 08/06/13   Inda Castle, MD  ondansetron (ZOFRAN-ODT) 8 MG disintegrating tablet Take 1 tablet (8 mg total) by mouth every 8 (eight) hours as needed for nausea. Patient not taking: Reported on 10/31/2017 11/20/15   Joretta Bachelor, PA    Family History Family History  Problem Relation Age of Onset  . Cancer Other   . Hypertension Other   . Diabetes Other   . Arthritis Mother   . Cancer Mother         colon and ovarian  . Diabetes Mother   . Arthritis Father   . Hypertension Father   . Diabetes Father   . Alcohol abuse Other   . Cancer Other        colon cancer  . Colon cancer Maternal Uncle   . Ovarian cancer Maternal Grandmother   . Heart disease Maternal Grandfather     Social History Social History   Tobacco Use  . Smoking status: Current Some Day Smoker  . Smokeless tobacco: Never Used  Substance Use Topics  . Alcohol use: Yes  . Drug use: No     Allergies   Penicillins; Propoxyphene n-acetaminophen; and Codeine   Review of Systems Review of Systems  Constitutional: Negative for chills and fever.  Respiratory: Negative for shortness of breath.   Cardiovascular: Negative for chest pain.  Gastrointestinal: Negative for abdominal pain, blood in stool, diarrhea, nausea and vomiting.  Genitourinary: Negative for difficulty urinating, dysuria, frequency, hematuria, vaginal bleeding and vaginal discharge.  Musculoskeletal: Positive for back pain.  Neurological: Negative for weakness and numbness.  All other systems reviewed and are negative.    Physical Exam Updated Vital Signs BP 122/77 (BP Location: Left Arm)   Pulse 67   Temp 98.2 F (36.8 C) (Oral)   Resp 20   SpO2 100%   Physical Exam  Constitutional: She appears well-developed and well-nourished. No distress.  Patient is standing at the side of the bed and appears uncomfortable.  HENT:  Head: Normocephalic and atraumatic.  Eyes: Conjunctivae are normal.  Neck: Neck supple.  Cardiovascular: Normal rate, regular rhythm, normal heart sounds and intact distal pulses.  Pulmonary/Chest: Effort normal and breath sounds normal. No respiratory distress.  Abdominal: Soft. There is no tenderness. There is no guarding.  Musculoskeletal: She exhibits tenderness. She exhibits no edema.       Back:  Lymphadenopathy:    She has no cervical adenopathy.  Neurological: She is alert.  Sensation grossly  intact to the lower extremities bilaterally. No saddle anesthesias. Strength 5/5 with flexion and extension at the bilateral hips, knees, and ankles. No noted gait deficit. Coordination intact with heel to shin testing.  Skin: Skin is warm and dry. She is not diaphoretic.  Psychiatric: She has a normal mood and affect. Her behavior is normal.  Nursing note and vitals reviewed.    ED Treatments / Results  Labs (all labs ordered are listed, but only abnormal results are displayed) Labs Reviewed  COMPREHENSIVE METABOLIC PANEL - Abnormal; Notable for the following components:      Result Value   Glucose, Bld 124 (*)    AST 13 (*)    All other components within normal limits  URINALYSIS, ROUTINE W REFLEX MICROSCOPIC - Abnormal; Notable for the  following components:   Hgb urine dipstick SMALL (*)    Bacteria, UA RARE (*)    All other components within normal limits  URINE CULTURE  CBC WITH DIFFERENTIAL/PLATELET  GC/CHLAMYDIA PROBE AMP ([place]) NOT AT [facility]    EKG None  Radiology Ct Renal Stone Study  Result Date: 10/31/2017 CLINICAL DATA:  52 year old female with left back pain for 2 days. Hurts when starts to urinate. Post abdominal hysterectomy. Initial encounter. EXAM: CT ABDOMEN AND PELVIS WITHOUT CONTRAST TECHNIQUE: Multidetector CT imaging of the abdomen and pelvis was performed following the standard protocol without IV contrast. COMPARISON:  None. FINDINGS: Lower chest: Basilar scarring/subsegmental atelectasis. Heart size top-normal. Hepatobiliary: Taking into account limitation by non contrast imaging, no worrisome hepatic lesion. No calcified gallstone. Pancreas: Taking into account limitation by non contrast imaging, no worrisome pancreatic mass or inflammation. Spleen: Taking into account limitation by non contrast imaging, no splenic mass or enlargement. Adrenals/Urinary Tract: No obstructing stone or hydronephrosis. Extrarenal pelvis configuration on the right. Taking  into account limitation by non contrast imaging, no worrisome renal or adrenal lesion. Vessel traverses along the right lateral aspect dominant bladder. Taking into account limitation by non contrast imaging, no endoluminal urinary bladder abnormality detected. Stomach/Bowel: Evaluation of portions of the colon, small bowel and stomach limited by under distension. No extraluminal bowel inflammatory process noted. Specifically, no inflammation surrounds the appendix. Vascular/Lymphatic: Atherosclerotic changes abdominal aorta without abdominal aortic aneurysm. Mild atherosclerotic changes right common iliac artery. Scattered small lymph nodes without adenopathy. Reproductive: Post hysterectomy.  No worrisome adnexal mass noted. Other: No free air or bowel containing hernia. Musculoskeletal: Mild scoliosis.  No osseous destructive lesion. IMPRESSION: No renal or ureteral obstructing stone or hydronephrosis. No extraluminal bowel inflammatory process. Specifically, no inflammation surrounds the appendix. Aortic Atherosclerosis (ICD10-I70.0). Post hysterectomy. Electronically Signed   By: Genia Del M.D.   On: 10/31/2017 08:43    Procedures Procedures (including critical care time)  Medications Ordered in ED Medications  sodium chloride 0.9 % bolus 1,000 mL (0 mLs Intravenous Stopped 10/31/17 0859)  ondansetron (ZOFRAN) injection 4 mg (4 mg Intravenous Given 10/31/17 0753)  morphine 4 MG/ML injection 4 mg (4 mg Intravenous Given 10/31/17 0754)  ketorolac (TORADOL) 30 MG/ML injection 15 mg (15 mg Intravenous Given 10/31/17 0906)  methocarbamol (ROBAXIN) tablet 1,000 mg (1,000 mg Oral Given 10/31/17 0906)     Initial Impression / Assessment and Plan / ED Course  I have reviewed the triage vital signs and the nursing notes.  Pertinent labs & imaging results that were available during my care of the patient were reviewed by me and considered in my medical decision making (see chart for details).  Clinical  Course as of Nov 01 1031  Tue Oct 31, 2017  3212 Patient states her pain has improved to 5/10.  She is lying in the bed and appears much more comfortable.   [SJ]    Clinical Course User Index [SJ] Joy, Shawn C, PA-C    Patient presents with back pain that she suspected may be due to an infection.  She is neurovascularly intact.  UA shows small amount of hemoglobin and rare bacteria, weak laboratory evidence for urinary infection, however, due to this patient's suspicion we will cover her with antibiotics.  CT without acute abnormality. She is point tender in the area in question and therefore her pain may very well be muscular in origin.  She had improvement in her pain during ED course.  PCP follow-up. The patient  was given instructions for home care as well as return precautions. Patient voices understanding of these instructions, accepts the plan, and is comfortable with discharge.  Vitals:   10/31/17 0627 10/31/17 0826  BP: 122/77 (!) 134/58  Pulse: 67 67  Resp: 20 16  Temp: 98.2 F (36.8 C)   TempSrc: Oral   SpO2: 100% 99%     Final Clinical Impressions(s) / ED Diagnoses   Final diagnoses:  Acute left-sided thoracic back pain    ED Discharge Orders        Ordered    cephALEXin (KEFLEX) 500 MG capsule  4 times daily     10/31/17 0929    methocarbamol (ROBAXIN) 500 MG tablet  2 times daily     10/31/17 0929    HYDROcodone-acetaminophen (NORCO/VICODIN) 5-325 MG tablet  Every 6 hours PRN     10/31/17 0929    lidocaine (LIDODERM) 5 %  Every 24 hours     10/31/17 0929       Lorayne Bender, PA-C 10/31/17 1033    Ripley Fraise, MD 10/31/17 2304

## 2017-11-01 LAB — URINE CULTURE: Culture: <10000 — AB

## 2017-11-07 ENCOUNTER — Encounter: Payer: Self-pay | Admitting: Internal Medicine

## 2017-11-07 ENCOUNTER — Ambulatory Visit: Payer: 59 | Admitting: Internal Medicine

## 2017-11-07 ENCOUNTER — Other Ambulatory Visit (INDEPENDENT_AMBULATORY_CARE_PROVIDER_SITE_OTHER): Payer: 59

## 2017-11-07 VITALS — BP 122/84 | HR 89 | Temp 98.1°F | Ht 60.0 in | Wt 141.0 lb

## 2017-11-07 DIAGNOSIS — R739 Hyperglycemia, unspecified: Secondary | ICD-10-CM

## 2017-11-07 DIAGNOSIS — M545 Low back pain, unspecified: Secondary | ICD-10-CM | POA: Insufficient documentation

## 2017-11-07 DIAGNOSIS — Z0001 Encounter for general adult medical examination with abnormal findings: Secondary | ICD-10-CM

## 2017-11-07 DIAGNOSIS — Z114 Encounter for screening for human immunodeficiency virus [HIV]: Secondary | ICD-10-CM | POA: Diagnosis not present

## 2017-11-07 DIAGNOSIS — E785 Hyperlipidemia, unspecified: Secondary | ICD-10-CM | POA: Diagnosis not present

## 2017-11-07 DIAGNOSIS — F17200 Nicotine dependence, unspecified, uncomplicated: Secondary | ICD-10-CM

## 2017-11-07 DIAGNOSIS — I700 Atherosclerosis of aorta: Secondary | ICD-10-CM | POA: Diagnosis not present

## 2017-11-07 DIAGNOSIS — R21 Rash and other nonspecific skin eruption: Secondary | ICD-10-CM | POA: Insufficient documentation

## 2017-11-07 LAB — BASIC METABOLIC PANEL
BUN: 15 mg/dL (ref 6–23)
CALCIUM: 10 mg/dL (ref 8.4–10.5)
CO2: 28 mEq/L (ref 19–32)
Chloride: 102 mEq/L (ref 96–112)
Creatinine, Ser: 0.84 mg/dL (ref 0.40–1.20)
GFR: 75.61 mL/min (ref >60.00)
Glucose, Bld: 110 mg/dL — ABNORMAL HIGH (ref 70–99)
Potassium: 3.5 mEq/L (ref 3.5–5.1)
SODIUM: 139 meq/L (ref 135–145)

## 2017-11-07 LAB — URINALYSIS, ROUTINE W REFLEX MICROSCOPIC
BILIRUBIN URINE: NEGATIVE
KETONES UR: NEGATIVE
Leukocytes, UA: NEGATIVE
NITRITE: NEGATIVE
RBC / HPF: NONE SEEN (ref 0–?)
Total Protein, Urine: NEGATIVE
Urine Glucose: NEGATIVE
Urobilinogen, UA: 0.2 (ref 0.0–1.0)
pH: 6 (ref 5.0–8.0)

## 2017-11-07 LAB — LIPID PANEL
CHOLESTEROL: 194 mg/dL (ref 0–200)
HDL: 64.8 mg/dL (ref >39.00)
LDL Cholesterol: 111 mg/dL — ABNORMAL HIGH (ref 0–99)
NonHDL: 129.34
TRIGLYCERIDES: 91 mg/dL (ref 0.0–149.0)
Total CHOL/HDL Ratio: 3
VLDL: 18.2 mg/dL (ref 0.0–40.0)

## 2017-11-07 LAB — HEMOGLOBIN A1C: Hgb A1c MFr Bld: 5.8 % (ref 4.6–6.5)

## 2017-11-07 LAB — TSH: TSH: 0.78 u[IU]/mL (ref 0.35–4.50)

## 2017-11-07 MED ORDER — ROSUVASTATIN CALCIUM 10 MG PO TABS: 10.0000 mg | ORAL_TABLET | Freq: Every day | ORAL | 3 refills | Status: DC

## 2017-11-07 MED ORDER — HYDROCODONE-ACETAMINOPHEN 5-325 MG PO TABS: 1.0000 | ORAL_TABLET | Freq: Four times a day (QID) | ORAL | 0 refills | Status: DC | PRN

## 2017-11-07 MED ORDER — VARENICLINE TARTRATE 0.5 MG X 11 & 1 MG X 42 PO MISC: ORAL | 0 refills | Status: DC

## 2017-11-07 MED ORDER — VARENICLINE TARTRATE 1 MG PO TABS: 1.0000 mg | ORAL_TABLET | Freq: Two times a day (BID) | ORAL | 1 refills | Status: DC

## 2017-11-07 MED ORDER — METHOCARBAMOL 500 MG PO TABS: 500.0000 mg | ORAL_TABLET | Freq: Two times a day (BID) | ORAL | 1 refills | Status: DC

## 2017-11-07 MED ORDER — LIDOCAINE 5 % EX PTCH: 1.0000 | MEDICATED_PATCH | CUTANEOUS | 1 refills | Status: DC

## 2017-11-07 NOTE — Patient Instructions (Addendum)
Please take all new medication as prescribed - the chantix (done hardcopy to shop around)  Please take all new medication as prescribed - generic crestor 10 mg per day - sent to pharmacy  Please continue all other medications as before, and refills have been done if requested - the pain patch, hydrocodone, muscle relaxer  Never take any medication related to penicillin the future  Please have the pharmacy call with any other refills you may need.  Please continue your efforts at being more active, low cholesterol diet, and weight control.  You are otherwise up to date with prevention measures today.  Please keep your appointments with your specialists as you may have planned  Please go to the LAB in the Basement (turn left off the elevator) for the tests to be done today  You will be contacted by phone if any changes need to be made immediately.  Otherwise, you will receive a letter about your results with an explanation, but please check with MyChart first.  Please remember to sign up for MyChart if you have not done so, as this will be important to you in the future with finding out test results, communicating by private email, and scheduling acute appointments online when needed.  Please return in 1 year for your yearly visit, or sooner if needed, with Lab testing done 3-5 days before

## 2017-11-07 NOTE — Assessment & Plan Note (Addendum)
Resolving, ok for benadryl 50 mg prn, keflex now listed as allergy

## 2017-11-07 NOTE — Assessment & Plan Note (Signed)

## 2017-11-07 NOTE — Assessment & Plan Note (Addendum)
C/w persistent msk spasm - for refill lidoderm, hydrocodone, muscle relaxer prn  Note:  Total time for pt hx, exam, review of record with pt in the room, determination of diagnoses and plan for further eval and tx is > 40 min, with over 50% spent in coordination and counseling of patient including the differential dx, tx, further evaluation and other management of LBP, smoker, HLD, aortic atherosclerosis, facial rash and hyperglycemia

## 2017-11-07 NOTE — Assessment & Plan Note (Signed)
In light of HLD, for start low dose statin, and quit smoking

## 2017-11-07 NOTE — Assessment & Plan Note (Signed)
Mild uncontrolled, for crestor 10 qd

## 2017-11-07 NOTE — Progress Notes (Signed)
Subjective:    Patient ID: Cassidy Lee, female    DOB: 1965/09/10, 52 y.o.   MRN: [id number]  HPI  Here for wellness and f/u;  Overall doing ok;  Pt denies Chest pain, worsening SOB, DOE, wheezing, orthopnea, PND, worsening LE edema, palpitations, dizziness or syncope.  Pt denies neurological change such as new headache, facial or extremity weakness.  Pt denies polydipsia, polyuria, or low sugar symptoms. Pt states overall good compliance with treatment and medications, good tolerability, and has been trying to follow appropriate diet.  Pt denies worsening depressive symptoms, suicidal ideation or panic. No fever, night sweats, wt loss, loss of appetite, or other constitutional symptoms.  Pt states good ability with ADL's, has low fall risk, home safety reviewed and adequate, no other significant changes in hearing or vision, and only occasionally active with exercise.  Also had acute left sided back pain mostly lower back after mowing the yard, needs med refills as pain persists,.  Also tx recently with keflex and known PCN allergy with facial swelling and rash, now resolved per pt after she stopped the keflex.  Some itching persists.  Wants to quit smoking, interested in chantix.  Has HLD and known aortic atherosclerosis, she is ok with start stat Past Medical History:  Diagnosis Date  . ALLERGIC RHINITIS 07/21/2008   Qualifier: Diagnosis of  By: Jenny Reichmann MD, Hunt Oris   . Allergy   . ANXIETY 07/21/2008   Qualifier: Diagnosis of  By: Jenny Reichmann MD, Hunt Oris   . Blood in stool   . Colon polyps   . COLONIC POLYPS, HX OF 07/21/2008   Qualifier: Diagnosis of  By: Jenny Reichmann MD, [place] spell   . 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 Preventative health care 06/19/2013  . UTI (lower urinary tract infection)    Past Surgical History:  Procedure Laterality Date  . ABDOMINAL  HYSTERECTOMY  2005  . foot surgury Left 2005    reports that she has been smoking.  She has never used smokeless tobacco. She reports that she drinks alcohol. She reports that she does not use drugs. family history includes Alcohol abuse in her other; Arthritis in her father and mother; Cancer in her mother, other, and other; Colon cancer in her maternal uncle; Diabetes in her father, mother, and other; Heart disease in her maternal grandfather; Hypertension in her father and other; Ovarian cancer in her maternal grandmother. Allergies  Allergen Reactions  . Penicillins Other (See Comments)    Has patient had a PCN reaction causing immediate rash, facial/tongue/throat swelling, SOB or lightheadedness with hypotension: Unknown Has patient had a PCN reaction causing severe rash involving mucus membranes or skin necrosis: No Has patient had a PCN reaction that required hospitalization: No Has patient had a PCN reaction occurring within the last 10 years: No If all of the above answers are "NO", then may proceed with Cephalosporin use. Patient states, "It just doesn't work."  . Propoxyphene N-Acetaminophen Hives  . Cephalexin Rash    Facial rash  . Codeine Hives and Rash   Current Outpatient Medications on File Prior to Visit  Medication Sig Dispense Refill  . ALPRAZolam (XANAX) 0.25 MG tablet Take 1 tablet (0.25 mg total) by mouth at bedtime as needed for anxiety. 30 tablet 3  . hyoscyamine (LEVSIN/SL) 0.125 MG SL  tablet Place 1 tablet (0.125 mg total) under the tongue every 4 (four) hours as needed. (Patient taking differently: Place 0.125 mg under the tongue every 4 (four) hours as needed for cramping. ) 30 tablet 0   No current facility-administered medications on file prior to visit.    Review of Systems Constitutional: Negative for other unusual diaphoresis, sweats, appetite or weight changes HENT: Negative for other worsening hearing loss, ear pain, facial swelling, mouth sores or neck  stiffness.   Eyes: Negative for other worsening pain, redness or other visual disturbance.  Respiratory: Negative for other stridor or swelling Cardiovascular: Negative for other palpitations or other chest pain  Gastrointestinal: Negative for worsening diarrhea or loose stools, blood in stool, distention or other pain Genitourinary: Negative for hematuria, flank pain or other change in urine volume.  Musculoskeletal: Negative for myalgias or other joint swelling.  Skin: Negative for other color change, or other wound or worsening drainage.  Neurological: Negative for other syncope or numbness. Hematological: Negative for other adenopathy or swelling Psychiatric/Behavioral: Negative for hallucinations, other worsening agitation, SI, self-injury, or new decreased concentration All other system neg per pt    Objective:   Physical Exam BP 122/84   Pulse 89   Temp 98.1 F (36.7 C) (Oral)   Ht 5' (1.524 m)   Wt 141 lb (64 kg)   SpO2 99%   BMI 27.54 kg/m  VS noted,  Constitutional: Pt is oriented to person, place, and time. Appears well-developed and well-nourished, in no significant distress and comfortable Head: Normocephalic and atraumatic  Eyes: Conjunctivae and EOM are normal. Pupils are equal, round, and reactive to light Right Ear: External ear normal without discharge Left Ear: External ear normal without discharge Nose: Nose without discharge or deformity Mouth/Throat: Oropharynx is without other ulcerations and moist  Neck: Normal range of motion. Neck supple. No JVD present. No tracheal deviation present or significant neck LA or mass Cardiovascular: Normal rate, regular rhythm, normal heart sounds and intact distal pulses.   Pulmonary/Chest: WOB normal and breath sounds without rales or wheezing  Abdominal: Soft. Bowel sounds are normal. NT. No HSM  Musculoskeletal: Normal range of motion. Exhibits no edema; left lumbar paravertebral tender and spasm mod to  severe Lymphadenopathy: Has no other cervical adenopathy.  Neurological: Pt is alert and oriented to person, place, and time. Pt has normal reflexes. No cranial nerve deficit. Motor grossly intact, Gait intact Skin: Skin is warm and dry. No rash noted or new ulcerations Psychiatric:  Has normal mood and affect. Behavior is normal without agitation No other exam findings  Lab Results  Component Value Date   WBC 7.3 10/31/2017   HGB 14.0 10/31/2017   HCT 40.9 10/31/2017   PLT 269 10/31/2017   GLUCOSE 124 (H) 10/31/2017   CHOL 198 06/20/2013   TRIG 50.0 06/20/2013   HDL 90.40 06/20/2013   LDLCALC 98 06/20/2013   ALT 14 10/31/2017   AST 13 (L) 10/31/2017   NA 139 10/31/2017   K 3.9 10/31/2017   CL 105 10/31/2017   CREATININE 0.72 10/31/2017   BUN 10 10/31/2017   CO2 24 10/31/2017   TSH 1.11 06/20/2013   INR 1.0 06/20/2013       Assessment & Plan:

## 2017-11-07 NOTE — Assessment & Plan Note (Signed)
Ok for chantix asd,  to f/u any worsening symptoms or concerns 

## 2017-11-07 NOTE — Assessment & Plan Note (Signed)
Lab Results  Component Value Date   HGBA1C 5.8 11/07/2017   stable overall by history and exam, recent data reviewed with pt, and pt to continue medical treatment as before,  to f/u any worsening symptoms or concerns

## 2017-11-08 ENCOUNTER — Encounter: Payer: Self-pay | Admitting: Internal Medicine

## 2017-11-08 LAB — HIV ANTIBODY (ROUTINE TESTING W REFLEX): HIV: NONREACTIVE

## 2018-04-07 ENCOUNTER — Encounter: Payer: Self-pay | Admitting: Internal Medicine

## 2018-11-06 ENCOUNTER — Other Ambulatory Visit (INDEPENDENT_AMBULATORY_CARE_PROVIDER_SITE_OTHER): Payer: Self-pay

## 2018-11-06 DIAGNOSIS — Z0001 Encounter for general adult medical examination with abnormal findings: Secondary | ICD-10-CM

## 2018-11-06 DIAGNOSIS — R739 Hyperglycemia, unspecified: Secondary | ICD-10-CM

## 2018-11-06 LAB — CBC WITH DIFFERENTIAL/PLATELET
Basophils Absolute: 0 10*3/uL (ref 0.0–0.1)
Basophils Relative: 0.9 % (ref 0.0–3.0)
Eosinophils Absolute: 0.1 10*3/uL (ref 0.0–0.7)
Eosinophils Relative: 2.7 % (ref 0.0–5.0)
HCT: 39.3 % (ref 36.0–46.0)
Hemoglobin: 13.5 g/dL (ref 12.0–15.0)
Lymphocytes Relative: 34.1 % (ref 12.0–46.0)
Lymphs Abs: 1.5 10*3/uL (ref 0.7–4.0)
MCHC: 34.3 g/dL (ref 30.0–36.0)
MCV: 85.9 fl (ref 78.0–100.0)
Monocytes Absolute: 0.3 10*3/uL (ref 0.1–1.0)
Monocytes Relative: 6.2 % (ref 3.0–12.0)
Neutro Abs: 2.4 10*3/uL (ref 1.4–7.7)
Neutrophils Relative %: 56.1 % (ref 43.0–77.0)
Platelets: 244 10*3/uL (ref 150.0–400.0)
RBC: 4.57 Mil/uL (ref 3.87–5.11)
RDW: 12.9 % (ref 11.5–15.5)
WBC: 4.3 10*3/uL (ref 4.0–10.5)

## 2018-11-06 LAB — URINALYSIS, ROUTINE W REFLEX MICROSCOPIC
Bilirubin Urine: NEGATIVE
Hgb urine dipstick: NEGATIVE
Ketones, ur: NEGATIVE
Leukocytes,Ua: NEGATIVE
Nitrite: NEGATIVE
RBC / HPF: NONE SEEN (ref 0–?)
Specific Gravity, Urine: 1.025 (ref 1.000–1.030)
Total Protein, Urine: NEGATIVE
Urine Glucose: NEGATIVE
Urobilinogen, UA: 0.2 (ref 0.0–1.0)
WBC, UA: NONE SEEN (ref 0–?)
pH: 6 (ref 5.0–8.0)

## 2018-11-06 LAB — LIPID PANEL
Cholesterol: 214 mg/dL — ABNORMAL HIGH (ref 0–200)
HDL: 88 mg/dL (ref >39.00)
LDL Cholesterol: 113 mg/dL — ABNORMAL HIGH (ref 0–99)
NonHDL: 126.35
Total CHOL/HDL Ratio: 2
Triglycerides: 67 mg/dL (ref 0.0–149.0)
VLDL: 13.4 mg/dL (ref 0.0–40.0)

## 2018-11-06 LAB — TSH: TSH: 1.34 u[IU]/mL (ref 0.35–4.50)

## 2018-11-06 LAB — BASIC METABOLIC PANEL
BUN: 16 mg/dL (ref 6–23)
CO2: 29 mEq/L (ref 19–32)
Calcium: 9.8 mg/dL (ref 8.4–10.5)
Chloride: 104 mEq/L (ref 96–112)
Creatinine, Ser: 0.94 mg/dL (ref 0.40–1.20)
GFR: 62.24 mL/min (ref >60.00)
Glucose, Bld: 102 mg/dL — ABNORMAL HIGH (ref 70–99)
Potassium: 3.9 mEq/L (ref 3.5–5.1)
Sodium: 141 mEq/L (ref 135–145)

## 2018-11-06 LAB — HEPATIC FUNCTION PANEL
ALT: 20 U/L (ref 0–35)
AST: 19 U/L (ref 0–37)
Albumin: 4.8 g/dL (ref 3.5–5.2)
Alkaline Phosphatase: 51 U/L (ref 39–117)
Bilirubin, Direct: 0.1 mg/dL (ref 0.0–0.3)
Total Bilirubin: 0.6 mg/dL (ref 0.2–1.2)
Total Protein: 7.3 g/dL (ref 6.0–8.3)

## 2018-11-06 LAB — HEMOGLOBIN A1C: Hgb A1c MFr Bld: 5.9 % (ref 4.6–6.5)

## 2018-11-09 ENCOUNTER — Telehealth: Payer: Self-pay | Admitting: Internal Medicine

## 2018-11-09 ENCOUNTER — Other Ambulatory Visit: Payer: Self-pay

## 2018-11-09 ENCOUNTER — Ambulatory Visit (INDEPENDENT_AMBULATORY_CARE_PROVIDER_SITE_OTHER): Payer: 59 | Admitting: Internal Medicine

## 2018-11-09 ENCOUNTER — Encounter: Payer: Self-pay | Admitting: Internal Medicine

## 2018-11-09 VITALS — BP 126/84 | HR 67 | Temp 98.0°F | Ht 60.0 in | Wt 152.0 lb

## 2018-11-09 DIAGNOSIS — Z0000 Encounter for general adult medical examination without abnormal findings: Secondary | ICD-10-CM

## 2018-11-09 DIAGNOSIS — E559 Vitamin D deficiency, unspecified: Secondary | ICD-10-CM

## 2018-11-09 DIAGNOSIS — E785 Hyperlipidemia, unspecified: Secondary | ICD-10-CM

## 2018-11-09 DIAGNOSIS — Z0001 Encounter for general adult medical examination with abnormal findings: Secondary | ICD-10-CM

## 2018-11-09 DIAGNOSIS — Q8789 Other specified congenital malformation syndromes, not elsewhere classified: Secondary | ICD-10-CM

## 2018-11-09 DIAGNOSIS — D126 Benign neoplasm of colon, unspecified: Secondary | ICD-10-CM

## 2018-11-09 DIAGNOSIS — R739 Hyperglycemia, unspecified: Secondary | ICD-10-CM | POA: Diagnosis not present

## 2018-11-09 DIAGNOSIS — M79672 Pain in left foot: Secondary | ICD-10-CM

## 2018-11-09 DIAGNOSIS — E538 Deficiency of other specified B group vitamins: Secondary | ICD-10-CM

## 2018-11-09 DIAGNOSIS — E611 Iron deficiency: Secondary | ICD-10-CM

## 2018-11-09 MED ORDER — ALPRAZOLAM 0.25 MG PO TABS: 0.2500 mg | ORAL_TABLET | Freq: Every evening | ORAL | 1 refills | Status: AC | PRN

## 2018-11-09 MED ORDER — ATORVASTATIN CALCIUM 10 MG PO TABS: 10.0000 mg | ORAL_TABLET | Freq: Every day | ORAL | 3 refills | Status: DC

## 2018-11-09 NOTE — Assessment & Plan Note (Signed)
Pt plans to f/u colonoscopy with Dr Watt Climes herself

## 2018-11-09 NOTE — Assessment & Plan Note (Signed)

## 2018-11-09 NOTE — Progress Notes (Signed)
Subjective:    Patient ID: Cassidy Lee, female    DOB: 1965/07/27, 53 y.o.   MRN: [id number]  HPI  Here for wellness and f/u;  Overall doing ok;  Pt denies Chest pain, worsening SOB, DOE, wheezing, orthopnea, PND, worsening LE edema, palpitations, dizziness or syncope.  Pt denies neurological change such as new headache, facial or extremity weakness.  Pt denies polydipsia, polyuria, or low sugar symptoms. Pt states overall good compliance with treatment and medications, good tolerability, and has been trying to follow appropriate diet.  Pt denies worsening depressive symptoms, suicidal ideation or panic. No fever, night sweats, wt loss, loss of appetite, or other constitutional symptoms.  Pt states good ability with ADL's, has low fall risk, home safety reviewed and adequate, no other significant changes in hearing or vision, and only occasionally active with exercise.  Has some leg cramps with crestor, but ok to try a new statin. Pt plans to call for f/u colonoscopy due to hx of [place].  Also having ongoing left foot pain s/p surgury per podiatry, and asks for referral back for insurance purpose Past Medical History:  Diagnosis Date  . ALLERGIC RHINITIS 07/21/2008   Qualifier: Diagnosis of  By: Jenny Reichmann MD, Hunt Oris   . Allergy   . ANXIETY 07/21/2008   Qualifier: Diagnosis of  By: Jenny Reichmann MD, Hunt Oris   . Blood in stool   . Colon polyps   . COLONIC POLYPS, HX OF 07/21/2008   Qualifier: Diagnosis of  By: Jenny Reichmann MD, [place] spell   . 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 Preventative health care 06/19/2013  . UTI (lower urinary tract infection)    Past Surgical History:  Procedure Laterality Date  . ABDOMINAL HYSTERECTOMY  2005  . foot surgury Left 2005    reports that she has been smoking. She has never used smokeless tobacco. She reports current alcohol use. She  reports that she does not use drugs. family history includes Alcohol abuse in an other family member; Arthritis in her father and mother; Cancer in her mother and other family members; Colon cancer in her maternal uncle; Diabetes in her father, mother, and another family member; Heart disease in her maternal grandfather; Hypertension in her father and another family member; Ovarian cancer in her maternal grandmother. Allergies  Allergen Reactions  . Penicillins Other (See Comments)    Has patient had a PCN reaction causing immediate rash, facial/tongue/throat swelling, SOB or lightheadedness with hypotension: Unknown Has patient had a PCN reaction causing severe rash involving mucus membranes or skin necrosis: No Has patient had a PCN reaction that required hospitalization: No Has patient had a PCN reaction occurring within the last 10 years: No If all of the above answers are "NO", then may proceed with Cephalosporin use. Patient states, "It just doesn't work."  . Propoxyphene N-Acetaminophen Hives  . Cephalexin Rash    Facial rash  . Codeine Hives and Rash   Current Outpatient Medications on File Prior to Visit  Medication Sig Dispense Refill  . hyoscyamine (LEVSIN/SL) 0.125 MG SL tablet Place 1 tablet (0.125 mg total) under the tongue every 4 (four) hours as needed. (Patient taking differently: Place 0.125 mg under the tongue every 4 (four) hours as needed for cramping. ) 30 tablet 0   No current facility-administered medications on  file prior to visit.    Review of Systems Constitutional: Negative for other unusual diaphoresis, sweats, appetite or weight changes HENT: Negative for other worsening hearing loss, ear pain, facial swelling, mouth sores or neck stiffness.   Eyes: Negative for other worsening pain, redness or other visual disturbance.  Respiratory: Negative for other stridor or swelling Cardiovascular: Negative for other palpitations or other chest pain  Gastrointestinal:  Negative for worsening diarrhea or loose stools, blood in stool, distention or other pain Genitourinary: Negative for hematuria, flank pain or other change in urine volume.  Musculoskeletal: Negative for myalgias or other joint swelling.  Skin: Negative for other color change, or other wound or worsening drainage.  Neurological: Negative for other syncope or numbness. Hematological: Negative for other adenopathy or swelling Psychiatric/Behavioral: Negative for hallucinations, other worsening agitation, SI, self-injury, or new decreased concentration All other system eng per pt    Objective:   Physical Exam BP 126/84   Pulse 67   Temp 98 F (36.7 C) (Oral)   Ht 5' (1.524 m)   Wt 152 lb (68.9 kg)   SpO2 99%   BMI 29.69 kg/m  VS noted,  Constitutional: Pt is oriented to person, place, and time. Appears well-developed and well-nourished, in no significant distress and comfortable Head: Normocephalic and atraumatic  Eyes: Conjunctivae and EOM are normal. Pupils are equal, round, and reactive to light Right Ear: External ear normal without discharge Left Ear: External ear normal without discharge Nose: Nose without discharge or deformity Mouth/Throat: Oropharynx is without other ulcerations and moist  Neck: Normal range of motion. Neck supple. No JVD present. No tracheal deviation present or significant neck LA or mass Cardiovascular: Normal rate, regular rhythm, normal heart sounds and intact distal pulses.   Pulmonary/Chest: WOB normal and breath sounds without rales or wheezing  Abdominal: Soft. Bowel sounds are normal. NT. No HSM  Musculoskeletal: Normal range of motion. Exhibits no edema Lymphadenopathy: Has no other cervical adenopathy.  Neurological: Pt is alert and oriented to person, place, and time. Pt has normal reflexes. No cranial nerve deficit. Motor grossly intact, Gait intact Skin: Skin is warm and dry. No rash noted or new ulcerations Psychiatric:  Has normal mood and  affect. Behavior is normal without agitation No other exam findings Lab Results  Component Value Date   WBC 4.3 11/06/2018   HGB 13.5 11/06/2018   HCT 39.3 11/06/2018   PLT 244.0 11/06/2018   GLUCOSE 102 (H) 11/06/2018   CHOL 214 (H) 11/06/2018   TRIG 67.0 11/06/2018   HDL 88.00 11/06/2018   LDLCALC 113 (H) 11/06/2018   ALT 20 11/06/2018   AST 19 11/06/2018   NA 141 11/06/2018   K 3.9 11/06/2018   CL 104 11/06/2018   CREATININE 0.94 11/06/2018   BUN 16 11/06/2018   CO2 29 11/06/2018   TSH 1.34 11/06/2018   INR 1.0 06/20/2013   HGBA1C 5.9 11/06/2018        Assessment & Plan:

## 2018-11-09 NOTE — Addendum Note (Signed)
Addended by: Biagio Borg on: 11/09/2018 12:06 PM   Modules accepted: Orders

## 2018-11-09 NOTE — Telephone Encounter (Signed)
Patient requesting script for alprazolam to be sent to [facility].

## 2018-11-09 NOTE — Assessment & Plan Note (Signed)
Ok to change the crestor to lipitor 10 qd, cont low chol diet

## 2018-11-09 NOTE — Assessment & Plan Note (Signed)
stable overall by history and exam, recent data reviewed with pt, and pt to continue medical treatment as before,  to f/u any worsening symptoms or concerns  

## 2018-11-09 NOTE — Telephone Encounter (Signed)
Done erx 

## 2018-11-09 NOTE — Patient Instructions (Signed)
Ok to change the crestor to lipitor 10 mg per day  Please continue all other medications as before, and refills have been done if requested.  Please have the pharmacy call with any other refills you may need.  Please continue your efforts at being more active, low cholesterol diet, and weight control.  You are otherwise up to date with prevention measures today.  Please keep your appointments with your specialists as you may have planned  Please return in 1 year for your yearly visit, or sooner if needed, with Lab testing done 3-5 days before

## 2018-11-23 ENCOUNTER — Other Ambulatory Visit: Payer: Self-pay

## 2018-11-23 ENCOUNTER — Ambulatory Visit (HOSPITAL_COMMUNITY): Admission: EM | Admit: 2018-11-23 | Discharge: 2018-11-23 | Discharge status: Against Medical Advice | Payer: 59

## 2018-11-23 NOTE — ED Notes (Signed)
Per pt access pt lwbs due to covid test not being free.

## 2018-12-18 ENCOUNTER — Encounter: Payer: Self-pay | Admitting: Internal Medicine

## 2019-02-06 ENCOUNTER — Encounter: Payer: Self-pay | Admitting: Podiatry

## 2019-02-06 ENCOUNTER — Other Ambulatory Visit: Payer: Self-pay

## 2019-02-06 ENCOUNTER — Ambulatory Visit (INDEPENDENT_AMBULATORY_CARE_PROVIDER_SITE_OTHER): Payer: 59

## 2019-02-06 ENCOUNTER — Ambulatory Visit: Payer: 59 | Admitting: Podiatry

## 2019-02-06 VITALS — BP 142/91 | HR 62

## 2019-02-06 DIAGNOSIS — M2042 Other hammer toe(s) (acquired), left foot: Secondary | ICD-10-CM | POA: Diagnosis not present

## 2019-02-06 DIAGNOSIS — M779 Enthesopathy, unspecified: Secondary | ICD-10-CM

## 2019-02-06 DIAGNOSIS — M2041 Other hammer toe(s) (acquired), right foot: Secondary | ICD-10-CM | POA: Diagnosis not present

## 2019-02-06 DIAGNOSIS — M2040 Other hammer toe(s) (acquired), unspecified foot: Secondary | ICD-10-CM

## 2019-02-07 ENCOUNTER — Other Ambulatory Visit: Payer: Self-pay | Admitting: Podiatry

## 2019-02-07 DIAGNOSIS — M2040 Other hammer toe(s) (acquired), unspecified foot: Secondary | ICD-10-CM

## 2019-02-08 NOTE — Progress Notes (Signed)
Subjective:   Patient ID: Cassidy Lee, female   DOB: 53 y.o.   MRN: [id number]   HPI Patient presents concerned about digital deformity of the left second and third digits stating that she is noted this getting worse recently and it seems to be inflamed in the joint.  Patient does not remember specific injury and had surgery a number of years ago which is done well.  Patient does not smoke likes to be active   Review of Systems  All other systems reviewed and are negative.       Objective:  Physical Exam Vitals signs and nursing note reviewed.  Constitutional:      Appearance: She is well-developed.  Pulmonary:     Effort: Pulmonary effort is normal.  Musculoskeletal: Normal range of motion.  Skin:    General: Skin is warm.  Neurological:     Mental Status: She is alert.     Neurovascular status intact muscle strength found to be adequate range of motion within normal limits.  Patient is found to have inflammation pain of the second MPJ left with fluid buildup around the joint surface localized with moderate separation second and third digits.  Patient has good digital perfusion     Assessment:  Possibility for flexor plate dislocation or stretch of the second MPJ left with inflammation and capsulitis around the joint surface     Plan:  H&P x-rays reviewed and today I did a sterile injection of the left forefoot 60 mg like Marcaine mixture and did sterile prep and then aspirated the second MPJ getting out a small amount of clear fluid and injected with quarter cc dexamethasone Kenalog and applied padding to reduce pressure on the joint along with rigid bottom shoes.  Discussed other treatments which may be necessary in future  X-rays indicate there is some separation of the digits no indications of arthritis or stress fracture

## 2019-02-27 ENCOUNTER — Ambulatory Visit: Payer: 59 | Admitting: Podiatry

## 2019-02-27 ENCOUNTER — Encounter: Payer: Self-pay | Admitting: Podiatry

## 2019-02-27 ENCOUNTER — Other Ambulatory Visit: Payer: Self-pay

## 2019-02-27 DIAGNOSIS — M779 Enthesopathy, unspecified: Secondary | ICD-10-CM | POA: Diagnosis not present

## 2019-02-27 DIAGNOSIS — M2042 Other hammer toe(s) (acquired), left foot: Secondary | ICD-10-CM

## 2019-02-28 NOTE — Progress Notes (Signed)
Subjective:   Patient ID: Cassidy Lee, female   DOB: 53 y.o.   MRN: [id number]   HPI Patient states that she is improving with pain in the outside of the left foot but overall getting better at the current time   ROS      Objective:  Physical Exam  Neurovascular status intact with patient's plantar pain improving and pain in the outside the foot mild at the insertion base of fifth metatarsal with no indication of muscle strength loss     Assessment:  Appears to be's 2 separate problems 1 being inflammation of the plantar fascial arch and the other being compensatory peroneal tendinitis     Plan:  H&P reviewed both conditions and recommended physical therapy anti-inflammatory support and if symptoms persist will need to consider other treatment options

## 2019-07-10 ENCOUNTER — Encounter: Payer: Self-pay | Admitting: Internal Medicine

## 2019-11-07 ENCOUNTER — Other Ambulatory Visit: Payer: Self-pay

## 2019-11-07 ENCOUNTER — Other Ambulatory Visit (INDEPENDENT_AMBULATORY_CARE_PROVIDER_SITE_OTHER): Payer: 59

## 2019-11-07 DIAGNOSIS — E559 Vitamin D deficiency, unspecified: Secondary | ICD-10-CM | POA: Diagnosis not present

## 2019-11-07 DIAGNOSIS — Z0001 Encounter for general adult medical examination with abnormal findings: Secondary | ICD-10-CM | POA: Diagnosis not present

## 2019-11-07 DIAGNOSIS — E538 Deficiency of other specified B group vitamins: Secondary | ICD-10-CM | POA: Diagnosis not present

## 2019-11-07 DIAGNOSIS — E611 Iron deficiency: Secondary | ICD-10-CM | POA: Diagnosis not present

## 2019-11-07 DIAGNOSIS — R739 Hyperglycemia, unspecified: Secondary | ICD-10-CM | POA: Diagnosis not present

## 2019-11-07 LAB — LIPID PANEL
Cholesterol: 180 mg/dL (ref 0–200)
HDL: 93.5 mg/dL (ref >39.00)
LDL Cholesterol: 76 mg/dL (ref 0–99)
NonHDL: 86.44
Total CHOL/HDL Ratio: 2
Triglycerides: 54 mg/dL (ref 0.0–149.0)
VLDL: 10.8 mg/dL (ref 0.0–40.0)

## 2019-11-07 LAB — CBC WITH DIFFERENTIAL/PLATELET
Basophils Absolute: 0 10*3/uL (ref 0.0–0.1)
Basophils Relative: 1 % (ref 0.0–3.0)
Eosinophils Absolute: 0.2 10*3/uL (ref 0.0–0.7)
Eosinophils Relative: 6.1 % — ABNORMAL HIGH (ref 0.0–5.0)
HCT: 37.9 % (ref 36.0–46.0)
Hemoglobin: 12.7 g/dL (ref 12.0–15.0)
Lymphocytes Relative: 24.6 % (ref 12.0–46.0)
Lymphs Abs: 0.9 10*3/uL (ref 0.7–4.0)
MCHC: 33.7 g/dL (ref 30.0–36.0)
MCV: 85.8 fl (ref 78.0–100.0)
Monocytes Absolute: 0.4 10*3/uL (ref 0.1–1.0)
Monocytes Relative: 10.4 % (ref 3.0–12.0)
Neutro Abs: 2.1 10*3/uL (ref 1.4–7.7)
Neutrophils Relative %: 57.9 % (ref 43.0–77.0)
Platelets: 208 10*3/uL (ref 150.0–400.0)
RBC: 4.41 Mil/uL (ref 3.87–5.11)
RDW: 13.6 % (ref 11.5–15.5)
WBC: 3.7 10*3/uL — ABNORMAL LOW (ref 4.0–10.5)

## 2019-11-07 LAB — BASIC METABOLIC PANEL
BUN: 15 mg/dL (ref 6–23)
CO2: 30 mEq/L (ref 19–32)
Calcium: 9.7 mg/dL (ref 8.4–10.5)
Chloride: 104 mEq/L (ref 96–112)
Creatinine, Ser: 0.8 mg/dL (ref 0.40–1.20)
GFR: 74.68 mL/min (ref >60.00)
Glucose, Bld: 106 mg/dL — ABNORMAL HIGH (ref 70–99)
Potassium: 4.2 mEq/L (ref 3.5–5.1)
Sodium: 139 mEq/L (ref 135–145)

## 2019-11-07 LAB — IBC PANEL
Iron: 75 ug/dL (ref 42–145)
Saturation Ratios: 18.4 % — ABNORMAL LOW (ref 20.0–50.0)
Transferrin: 291 mg/dL (ref 212.0–360.0)

## 2019-11-07 LAB — URINALYSIS, ROUTINE W REFLEX MICROSCOPIC
Bilirubin Urine: NEGATIVE
Hgb urine dipstick: NEGATIVE
Ketones, ur: NEGATIVE
Leukocytes,Ua: NEGATIVE
Nitrite: NEGATIVE
RBC / HPF: NONE SEEN (ref 0–?)
Specific Gravity, Urine: 1.02 (ref 1.000–1.030)
Total Protein, Urine: NEGATIVE
Urine Glucose: NEGATIVE
Urobilinogen, UA: 0.2 (ref 0.0–1.0)
pH: 6 (ref 5.0–8.0)

## 2019-11-07 LAB — HEPATIC FUNCTION PANEL
ALT: 19 U/L (ref 0–35)
AST: 18 U/L (ref 0–37)
Albumin: 4.6 g/dL (ref 3.5–5.2)
Alkaline Phosphatase: 62 U/L (ref 39–117)
Bilirubin, Direct: 0.1 mg/dL (ref 0.0–0.3)
Total Bilirubin: 0.4 mg/dL (ref 0.2–1.2)
Total Protein: 7.4 g/dL (ref 6.0–8.3)

## 2019-11-07 LAB — TSH: TSH: 1.52 u[IU]/mL (ref 0.35–4.50)

## 2019-11-07 LAB — HEMOGLOBIN A1C: Hgb A1c MFr Bld: 5.8 % (ref 4.6–6.5)

## 2019-11-07 LAB — VITAMIN B12: Vitamin B-12: 366 pg/mL (ref 211–911)

## 2019-11-07 LAB — VITAMIN D 25 HYDROXY (VIT D DEFICIENCY, FRACTURES): VITD: 19.09 ng/mL — ABNORMAL LOW (ref 30.00–100.00)

## 2019-11-11 ENCOUNTER — Encounter: Payer: 59 | Admitting: Internal Medicine

## 2019-11-25 ENCOUNTER — Encounter: Payer: 59 | Admitting: Internal Medicine

## 2019-11-28 ENCOUNTER — Ambulatory Visit (INDEPENDENT_AMBULATORY_CARE_PROVIDER_SITE_OTHER): Payer: 59 | Admitting: Internal Medicine

## 2019-11-28 ENCOUNTER — Encounter: Payer: Self-pay | Admitting: Internal Medicine

## 2019-11-28 ENCOUNTER — Other Ambulatory Visit: Payer: Self-pay

## 2019-11-28 VITALS — BP 120/78 | HR 71 | Temp 98.6°F | Ht 60.0 in | Wt 145.0 lb

## 2019-11-28 DIAGNOSIS — Z0000 Encounter for general adult medical examination without abnormal findings: Secondary | ICD-10-CM | POA: Diagnosis not present

## 2019-11-28 DIAGNOSIS — E559 Vitamin D deficiency, unspecified: Secondary | ICD-10-CM | POA: Diagnosis not present

## 2019-11-28 DIAGNOSIS — Q8789 Other specified congenital malformation syndromes, not elsewhere classified: Secondary | ICD-10-CM

## 2019-11-28 DIAGNOSIS — R739 Hyperglycemia, unspecified: Secondary | ICD-10-CM | POA: Diagnosis not present

## 2019-11-28 NOTE — Patient Instructions (Addendum)
Please remember to call Dr Watt Climes for your colonoscopy  Please take OTC Vitamin D3 at 2000 units per day, indefinitely.  Please continue all other medications as before, and refills have been done if requested.  Please have the pharmacy call with any other refills you may need.  Please continue your efforts at being more active, low cholesterol diet, and weight control.  You are otherwise up to date with prevention measures today.  Please keep your appointments with your specialists as you may have planned  Please make an Appointment to return for your 1 year visit, or sooner if needed, with Lab testing by Appointment as well, to be done about 3-5 days before at the [facility] (so this is for TWO appointments - please see the scheduling desk as you leave)

## 2019-11-28 NOTE — Progress Notes (Addendum)
Subjective:    Patient ID: Cassidy Lee, female    DOB: 12/21/1965, 54 y.o.   MRN: [id number]  HPI  Here for wellness and f/u;  Overall doing ok;  Pt denies Chest pain, worsening SOB, DOE, wheezing, orthopnea, PND, worsening LE edema, palpitations, dizziness or syncope.  Pt denies neurological change such as new headache, facial or extremity weakness.  Pt denies polydipsia, polyuria, or low sugar symptoms. Pt states overall good compliance with treatment and medications, good tolerability, and has been trying to follow appropriate diet.  Pt denies worsening depressive symptoms, suicidal ideation or panic. No fever, night sweats, wt loss, loss of appetite, or other constitutional symptoms.  Pt states good ability with ADL's, has low fall risk, home safety reviewed and adequate, no other significant changes in hearing or vision, and only occasionally active with exercise.  No new complaints Past Medical History:  Diagnosis Date  . ALLERGIC RHINITIS 07/21/2008   Qualifier: Diagnosis of  By: Jenny Reichmann MD, Hunt Oris   . Allergy   . ANXIETY 07/21/2008   Qualifier: Diagnosis of  By: Jenny Reichmann MD, Hunt Oris   . Blood in stool   . Colon polyps   . COLONIC POLYPS, HX OF 07/21/2008   Qualifier: Diagnosis of  By: Jenny Reichmann MD, [place] spell   . 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 Preventative health care 06/19/2013  . UTI (lower urinary tract infection)    Past Surgical History:  Procedure Laterality Date  . ABDOMINAL HYSTERECTOMY  2005  . foot surgury Left 2005    reports that she has been smoking. She has never used smokeless tobacco. She reports current alcohol use. She reports that she does not use drugs. family history includes Alcohol abuse in an other family member; Arthritis in her father and mother; Cancer in her mother and other family members; Colon cancer in her maternal  uncle; Diabetes in her father, mother, and another family member; Heart disease in her maternal grandfather; Hypertension in her father and another family member; Ovarian cancer in her maternal grandmother. Allergies  Allergen Reactions  . Penicillins Other (See Comments)    Has patient had a PCN reaction causing immediate rash, facial/tongue/throat swelling, SOB or lightheadedness with hypotension: Unknown Has patient had a PCN reaction causing severe rash involving mucus membranes or skin necrosis: No Has patient had a PCN reaction that required hospitalization: No Has patient had a PCN reaction occurring within the last 10 years: No If all of the above answers are "NO", then may proceed with Cephalosporin use. Patient states, "It just doesn't work."  . Propoxyphene N-Acetaminophen Hives  . Cephalexin Rash    Facial rash  . Codeine Hives and Rash   Current Outpatient Medications on File Prior to Visit  Medication Sig Dispense Refill  . ALPRAZolam (XANAX) 0.25 MG tablet Take 1 tablet (0.25 mg total) by mouth at bedtime as needed for anxiety. 90 tablet 1  . atorvastatin (LIPITOR) 10 MG tablet Take 1 tablet (10 mg total) by mouth daily. 90 tablet 3  . hyoscyamine (LEVSIN/SL) 0.125 MG SL tablet Place 1 tablet (0.125 mg total) under the tongue every 4 (four) hours as needed. (Patient taking differently: Place 0.125 mg under the tongue every 4 (four) hours as needed for cramping. ) 30 tablet 0   No current facility-administered  medications on file prior to visit.   Review of Systems All otherwise neg per pt    Objective:   Physical Exam BP 120/78 (BP Location: Left Arm, Patient Position: Sitting, Cuff Size: Large)   Pulse 71   Temp 98.6 F (37 C) (Oral)   Ht 5' (1.524 m)   Wt 145 lb (65.8 kg)   SpO2 97%   BMI 28.32 kg/m 'VS noted,  Constitutional: Pt appears in NAD HENT: Head: NCAT.  Right Ear: External ear normal.  Left Ear: External ear normal.  Eyes: . Pupils are equal, round,  and reactive to light. Conjunctivae and EOM are normal Nose: without d/c or deformity Neck: Neck supple. Gross normal ROM Cardiovascular: Normal rate and regular rhythm.   Pulmonary/Chest: Effort normal and breath sounds without rales or wheezing.  Abd:  Soft, NT, ND, + BS, no organomegaly Neurological: Pt is alert. At baseline orientation, motor grossly intact Skin: Skin is warm. No rashes, other new lesions, no LE edema Psychiatric: Pt behavior is normal without agitation  All otherwise neg per pt Lab Results  Component Value Date   WBC 3.7 (L) 11/07/2019   HGB 12.7 11/07/2019   HCT 37.9 11/07/2019   PLT 208.0 11/07/2019   GLUCOSE 106 (H) 11/07/2019   CHOL 180 11/07/2019   TRIG 54.0 11/07/2019   HDL 93.50 11/07/2019   LDLCALC 76 11/07/2019   ALT 19 11/07/2019   AST 18 11/07/2019   NA 139 11/07/2019   K 4.2 11/07/2019   CL 104 11/07/2019   CREATININE 0.80 11/07/2019   BUN 15 11/07/2019   CO2 30 11/07/2019   TSH 1.52 11/07/2019   INR 1.0 06/20/2013   HGBA1C 5.8 11/07/2019          Assessment & Plan:

## 2019-11-30 ENCOUNTER — Encounter: Payer: Self-pay | Admitting: Internal Medicine

## 2019-11-30 NOTE — Assessment & Plan Note (Signed)
stable overall by history and exam, recent data reviewed with pt, and pt to continue medical treatment as before,  to f/u any worsening symptoms or concerns  

## 2019-11-30 NOTE — Assessment & Plan Note (Signed)
For oral replacement 

## 2019-11-30 NOTE — Assessment & Plan Note (Signed)
Has regular GI f/u

## 2019-12-27 ENCOUNTER — Other Ambulatory Visit: Payer: Self-pay | Admitting: Internal Medicine

## 2019-12-27 NOTE — Telephone Encounter (Signed)
Please refill as per office routine med refill policy (all routine meds refilled for 3 mo or monthly per pt preference up to one year from last visit, then month to month grace period for 3 mo, then further med refills will have to be denied)  

## 2020-09-08 ENCOUNTER — Other Ambulatory Visit: Payer: Self-pay | Admitting: Physician Assistant

## 2020-09-08 DIAGNOSIS — R1013 Epigastric pain: Secondary | ICD-10-CM

## 2020-09-17 ENCOUNTER — Ambulatory Visit
Admission: RE | Admit: 2020-09-17 | Discharge: 2020-09-17 | Disposition: A | Discharge status: Home / Self Care | Payer: 59 | Source: Ambulatory Visit | Attending: Physician Assistant | Admitting: Physician Assistant

## 2020-09-17 ENCOUNTER — Other Ambulatory Visit: Payer: Self-pay

## 2020-09-17 DIAGNOSIS — R1013 Epigastric pain: Secondary | ICD-10-CM

## 2021-05-16 ENCOUNTER — Encounter: Payer: Self-pay | Admitting: Internal Medicine

## 2021-05-19 ENCOUNTER — Other Ambulatory Visit (INDEPENDENT_AMBULATORY_CARE_PROVIDER_SITE_OTHER): Payer: 59

## 2021-05-19 DIAGNOSIS — E559 Vitamin D deficiency, unspecified: Secondary | ICD-10-CM | POA: Diagnosis not present

## 2021-05-19 DIAGNOSIS — R739 Hyperglycemia, unspecified: Secondary | ICD-10-CM

## 2021-05-19 DIAGNOSIS — Z0000 Encounter for general adult medical examination without abnormal findings: Secondary | ICD-10-CM | POA: Diagnosis not present

## 2021-05-19 LAB — LIPID PANEL
Cholesterol: 156 mg/dL (ref 0–200)
HDL: 77.2 mg/dL (ref >39.00)
LDL Cholesterol: 68 mg/dL (ref 0–99)
NonHDL: 79.09
Total CHOL/HDL Ratio: 2
Triglycerides: 54 mg/dL (ref 0.0–149.0)
VLDL: 10.8 mg/dL (ref 0.0–40.0)

## 2021-05-19 LAB — TSH: TSH: 1.34 u[IU]/mL (ref 0.35–5.50)

## 2021-05-19 LAB — URINALYSIS, ROUTINE W REFLEX MICROSCOPIC
Bilirubin Urine: NEGATIVE
Hgb urine dipstick: NEGATIVE
Ketones, ur: NEGATIVE
Leukocytes,Ua: NEGATIVE
Nitrite: NEGATIVE
RBC / HPF: NONE SEEN (ref 0–?)
Specific Gravity, Urine: 1.02 (ref 1.000–1.030)
Total Protein, Urine: NEGATIVE
Urine Glucose: NEGATIVE
Urobilinogen, UA: 0.2 (ref 0.0–1.0)
pH: 7 (ref 5.0–8.0)

## 2021-05-19 LAB — CBC WITH DIFFERENTIAL/PLATELET
Basophils Absolute: 0 10*3/uL (ref 0.0–0.1)
Basophils Relative: 0.7 % (ref 0.0–3.0)
Eosinophils Absolute: 0.1 10*3/uL (ref 0.0–0.7)
Eosinophils Relative: 1.8 % (ref 0.0–5.0)
HCT: 35.6 % — ABNORMAL LOW (ref 36.0–46.0)
Hemoglobin: 12 g/dL (ref 12.0–15.0)
Lymphocytes Relative: 22.4 % (ref 12.0–46.0)
Lymphs Abs: 0.8 10*3/uL (ref 0.7–4.0)
MCHC: 33.7 g/dL (ref 30.0–36.0)
MCV: 85.9 fl (ref 78.0–100.0)
Monocytes Absolute: 0.2 10*3/uL (ref 0.1–1.0)
Monocytes Relative: 6.8 % (ref 3.0–12.0)
Neutro Abs: 2.4 10*3/uL (ref 1.4–7.7)
Neutrophils Relative %: 68.3 % (ref 43.0–77.0)
Platelets: 217 10*3/uL (ref 150.0–400.0)
RBC: 4.15 Mil/uL (ref 3.87–5.11)
RDW: 13.4 % (ref 11.5–15.5)
WBC: 3.6 10*3/uL — ABNORMAL LOW (ref 4.0–10.5)

## 2021-05-19 LAB — VITAMIN D 25 HYDROXY (VIT D DEFICIENCY, FRACTURES): VITD: 7.42 ng/mL — ABNORMAL LOW (ref 30.00–100.00)

## 2021-05-19 LAB — HEMOGLOBIN A1C: Hgb A1c MFr Bld: 5.8 % (ref 4.6–6.5)

## 2021-05-19 NOTE — Addendum Note (Signed)
Addended by: Susy Manor on: 11/12/373 08:54 AM   Modules accepted: Orders

## 2021-05-21 ENCOUNTER — Ambulatory Visit: Payer: 59 | Admitting: Internal Medicine

## 2021-05-21 ENCOUNTER — Other Ambulatory Visit: Payer: Self-pay

## 2021-05-21 VITALS — BP 136/80 | HR 116 | Temp 98.7°F | Ht 60.0 in | Wt 144.5 lb

## 2021-05-21 DIAGNOSIS — Z0001 Encounter for general adult medical examination with abnormal findings: Secondary | ICD-10-CM | POA: Diagnosis not present

## 2021-05-21 DIAGNOSIS — I700 Atherosclerosis of aorta: Secondary | ICD-10-CM

## 2021-05-21 DIAGNOSIS — F17200 Nicotine dependence, unspecified, uncomplicated: Secondary | ICD-10-CM | POA: Diagnosis not present

## 2021-05-21 DIAGNOSIS — Z23 Encounter for immunization: Secondary | ICD-10-CM

## 2021-05-21 DIAGNOSIS — Z01419 Encounter for gynecological examination (general) (routine) without abnormal findings: Secondary | ICD-10-CM | POA: Diagnosis not present

## 2021-05-21 DIAGNOSIS — R739 Hyperglycemia, unspecified: Secondary | ICD-10-CM

## 2021-05-21 DIAGNOSIS — E559 Vitamin D deficiency, unspecified: Secondary | ICD-10-CM | POA: Diagnosis not present

## 2021-05-21 DIAGNOSIS — Z1159 Encounter for screening for other viral diseases: Secondary | ICD-10-CM

## 2021-05-21 DIAGNOSIS — L989 Disorder of the skin and subcutaneous tissue, unspecified: Secondary | ICD-10-CM

## 2021-05-21 DIAGNOSIS — Z1231 Encounter for screening mammogram for malignant neoplasm of breast: Secondary | ICD-10-CM

## 2021-05-21 DIAGNOSIS — E7800 Pure hypercholesterolemia, unspecified: Secondary | ICD-10-CM

## 2021-05-21 DIAGNOSIS — J4520 Mild intermittent asthma, uncomplicated: Secondary | ICD-10-CM | POA: Diagnosis not present

## 2021-05-21 DIAGNOSIS — Q8789 Other specified congenital malformation syndromes, not elsewhere classified: Secondary | ICD-10-CM

## 2021-05-21 DIAGNOSIS — E538 Deficiency of other specified B group vitamins: Secondary | ICD-10-CM

## 2021-05-21 DIAGNOSIS — F411 Generalized anxiety disorder: Secondary | ICD-10-CM

## 2021-05-21 NOTE — Assessment & Plan Note (Signed)
Last vitamin D Lab Results  Component Value Date   VD25OH 7.42 (L) 05/19/2021   Low, to start oral replacement

## 2021-05-21 NOTE — Patient Instructions (Signed)
You had the Shingrix (shingles) shot #1 today  Please see the Nurse with a Nurse Visit in 2 months for the Shingrix shot #2  Please take OTC Vitamin D3 at 2000 units per day, indefinitely  You can also use the OTC Voltaren gel topically for arthritic joints as needed    Please continue all other medications as before, and refills have been done if requested.  Please have the pharmacy call with any other refills you may need.  Please continue your efforts at being more active, low cholesterol diet, and weight control.  You are otherwise up to date with prevention measures today.  Please keep your appointments with your specialists as you may have planned  You will be contacted regarding the referral for: mammogram, GYN, and dermatology  Please make an Appointment to return for your 1 year visit, or sooner if needed, with Lab testing by Appointment as well, to be done about 3-5 days before at the [facility] (so this is for TWO appointments - please see the scheduling desk as you leave)  Due to the ongoing Covid 19 pandemic, our lab now requires an appointment for any labs done at our office.  If you need labs done and do not have an appointment, please call our office ahead of time to schedule before presenting to the lab for your testing.

## 2021-05-21 NOTE — Progress Notes (Signed)
Patient ID: Cassidy Lee, female   DOB: 21-Aug-1965, 56 y.o.   MRN: [id number]         Chief Complaint:: wellness exam and Annual Exam, Referral, and Asthma (Patient wants a referral to dermatology)  , low vit d, hld, smoker       HPI:  Cassidy Lee is a 56 y.o. 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  Not taking Vit D.  Pt denies chest pain, increased sob or doe, wheezing, orthopnea, PND, increased LE swelling, palpitations, dizziness or syncope.   Pt denies polydipsia, polyuria, or new focal neuro s/s.   Pt denies fever, wt loss, night sweats, loss of appetite, or other constitutional symptoms  Denies worsening depressive symptoms, suicidal ideation, or panic; has ongoing anxiety .  Still smoking, not ready to quit.  Trying to follow lower chol diet.   .      Wt Readings from Last 3 Encounters:  05/21/21 144 lb 8 oz (65.5 kg)  11/28/19 145 lb (65.8 kg)  11/09/18 152 lb (68.9 kg)   BP Readings from Last 3 Encounters:  05/21/21 136/80  11/28/19 120/78  02/06/19 (!) 142/91   Immunization History  Administered Date(s) Administered   Influenza-Unspecified 01/09/2021   PFIZER(Purple Top)SARS-COV-2 Vaccination 10/14/2019, 11/04/2019   Td 07/21/2008   Tdap 05/10/2016   Zoster Recombinat (Shingrix) 05/21/2021   Health Maintenance Due  Topic Date Due   Hepatitis C Screening  Never done   PAP SMEAR-Modifier  07/01/2012   MAMMOGRAM  09/12/2019      Past Medical History:  Diagnosis Date   ALLERGIC RHINITIS 07/21/2008   Qualifier: Diagnosis of  By: Jenny Reichmann MD, Hunt Oris    Allergy    ANXIETY 07/21/2008   Qualifier: Diagnosis of  By: Jenny Reichmann MD, Hunt Oris    Blood in stool    Colon polyps     COLONIC POLYPS, HX OF 07/21/2008   Qualifier: Diagnosis of  By: Jenny Reichmann MD, Hunt Oris    Fainting spell    IBS 07/21/2008   Qualifier: Diagnosis of  By: Jenny Reichmann MD, Hunt Oris    INSOMNIA-SLEEP [place] 07/01/2009   Qualifier: Diagnosis of  By: Jenny Reichmann MD, Hunt Oris    Preventative health care 06/19/2013   UTI (lower urinary tract infection)    Past Surgical History:  Procedure Laterality Date   ABDOMINAL HYSTERECTOMY  2005   foot surgury Left 2005    reports that she has been smoking. She has never used smokeless tobacco. She reports current alcohol use. She reports that she does not use drugs. family history includes Alcohol abuse in an other family member; Arthritis in her father and mother; Cancer in her mother and other family members; Colon cancer in her maternal uncle; Diabetes in her father, mother, and another family member; Heart disease in her maternal grandfather; Hypertension in her father and another family member; Ovarian cancer in her maternal grandmother. Allergies  Allergen Reactions   Penicillins Other (See Comments)    Has patient had a PCN reaction causing immediate rash, facial/tongue/throat swelling, SOB or lightheadedness with hypotension: Unknown Has  patient had a PCN reaction causing severe rash involving mucus membranes or skin necrosis: No Has patient had a PCN reaction that required hospitalization: No Has patient had a PCN reaction occurring within the last 10 years: No If all of the above answers are "NO", then may proceed with Cephalosporin use. Patient states, "It just doesn't work."   Propoxyphene N-Acetaminophen Hives   Cephalexin Rash    Facial rash   Codeine Hives and Rash   Current Outpatient Medications on File Prior to Visit  Medication Sig Dispense Refill   ALPRAZolam (XANAX) 0.25 MG tablet Take 1 tablet (0.25 mg total) by mouth at bedtime as needed for anxiety. 90 tablet 1   atorvastatin (LIPITOR) 10 MG tablet TAKE 1 TABLET BY MOUTH EVERY DAY 90 tablet 3    hyoscyamine (LEVSIN/SL) 0.125 MG SL tablet Place 1 tablet (0.125 mg total) under the tongue every 4 (four) hours as needed. (Patient taking differently: Place 0.125 mg under the tongue every 4 (four) hours as needed for cramping.) 30 tablet 0   No current facility-administered medications on file prior to visit.        ROS:  All others reviewed and negative.  Objective        PE:  BP 136/80    Pulse (!) 116    Temp 98.7 F (37.1 C) (Oral)    Ht 5' (1.524 m)    Wt 144 lb 8 oz (65.5 kg)    SpO2 98%    BMI 28.22 kg/m                 Constitutional: Pt appears in NAD               HENT: Head: NCAT.                Right Ear: External ear normal.                 Left Ear: External ear normal.                Eyes: . Pupils are equal, round, and reactive to light. Conjunctivae and EOM are normal               Nose: without d/c or deformity               Neck: Neck supple. Gross normal ROM               Cardiovascular: Normal rate and regular rhythm.                 Pulmonary/Chest: Effort normal and breath sounds without rales or wheezing.                Abd:  Soft, NT, ND, + BS, no organomegaly               Neurological: Pt is alert. At baseline orientation, motor grossly intact               Skin: Skin is warm. No rashes, no other new lesions, LE edema - none               Psychiatric: Pt behavior is normal without agitation   Micro: none  Cardiac tracings I have personally interpreted today:  none  Pertinent Radiological findings (summarize): none   Lab Results  Component Value Date   WBC 3.6 (L) 05/19/2021   HGB 12.0 05/19/2021   HCT 35.6 (L) 05/19/2021   PLT 217.0 05/19/2021  GLUCOSE 106 (H) 11/07/2019   CHOL 156 05/19/2021   TRIG 54.0 05/19/2021   HDL 77.20 05/19/2021   LDLCALC 68 05/19/2021   ALT 19 11/07/2019   AST 18 11/07/2019   NA 139 11/07/2019   K 4.2 11/07/2019   CL 104 11/07/2019   CREATININE 0.80 11/07/2019   BUN 15 11/07/2019   CO2 30 11/07/2019   TSH  1.34 05/19/2021   INR 1.0 06/20/2013   HGBA1C 5.8 05/19/2021   Assessment/Plan:  Cassidy Lee is a 56 y.o. White or Caucasian [1] female with  has a past medical history of ALLERGIC RHINITIS (07/21/2008), Allergy, ANXIETY (07/21/2008), Blood in stool, Colon polyps, COLONIC POLYPS, HX OF (07/21/2008), Fainting spell, IBS (07/21/2008), INSOMNIA-SLEEP DISORDER-UNSPEC (07/01/2009), Preventative health care (06/19/2013), and UTI (lower urinary tract infection).  Vitamin D deficiency Last vitamin D Lab Results  Component Value Date   VD25OH 7.42 (L) 05/19/2021   Low, to start oral replacement   Encounter for well adult exam with abnormal findings Age and sex appropriate education and counseling updated with regular exercise and diet Referrals for preventative services - due for hep c screen, yearly colonoscopy, needs gyn referral and mammogram Immunizations addressed - declines covid booster but for shingrix #1 today Smoking counseling  - pt counsled to qjit, pt not ready Evidence for depression or other mood disorder - chronic anxiety no change Most recent labs reviewed. I have personally reviewed and have noted: 1) the patient's medical and social history 2) The patient's current medications and supplements 3) The patient's height, weight, and BMI have been recorded in the chart   Anxiety state Stable overall, cont current med tx - xanax prn  Aortic atherosclerosis ([place]) Pt to continue lipitor, stop smoking, excericse, low chol diet  Asthma Mild intermittent, declines inhaler prn for now  Gardner syndrome Now for yearly colonoscopy  HLD (hyperlipidemia) Lab Results  Component Value Date   LDLCALC 68 05/19/2021   Stable, pt to continue current statin lipitor 10   Hyperglycemia Lab Results  Component Value Date   HGBA1C 5.8 05/19/2021   Stable, pt to continue current medical treatment  - diet   Smoker Pt counsled to quit, pt not ready  Skin lesion To RUQ area - for  derm referral but I suspect probably benign  Followup: No follow-ups on file.  Cathlean Cower, MD 05/22/2021 6:47 AM [facility]

## 2021-05-22 DIAGNOSIS — J45909 Unspecified asthma, uncomplicated: Secondary | ICD-10-CM | POA: Insufficient documentation

## 2021-05-22 DIAGNOSIS — L989 Disorder of the skin and subcutaneous tissue, unspecified: Secondary | ICD-10-CM | POA: Insufficient documentation

## 2021-05-22 NOTE — Assessment & Plan Note (Signed)
Mild intermittent, declines inhaler prn for now

## 2021-05-22 NOTE — Assessment & Plan Note (Signed)
Pt counsled to quit, pt not ready °

## 2021-05-22 NOTE — Assessment & Plan Note (Addendum)
Age and sex appropriate education and counseling updated with regular exercise and diet Referrals for preventative services - due for hep c screen, yearly colonoscopy, needs gyn referral and mammogram Immunizations addressed - declines covid booster but for shingrix #1 today Smoking counseling  - pt counsled to qjit, pt not ready Evidence for depression or other mood disorder - chronic anxiety no change Most recent labs reviewed. I have personally reviewed and have noted: 1) the patient's medical and social history 2) The patient's current medications and supplements 3) The patient's height, weight, and BMI have been recorded in the chart

## 2021-05-22 NOTE — Assessment & Plan Note (Signed)
Stable overall, cont current med tx - xanax prn

## 2021-05-22 NOTE — Assessment & Plan Note (Signed)
Now for yearly colonoscopy

## 2021-05-22 NOTE — Assessment & Plan Note (Signed)
Pt to continue lipitor, stop smoking, excericse, low chol diet

## 2021-05-22 NOTE — Assessment & Plan Note (Signed)
Lab Results  Component Value Date   HGBA1C 5.8 05/19/2021   Stable, pt to continue current medical treatment  - diet

## 2021-05-22 NOTE — Assessment & Plan Note (Signed)
To RUQ area - for derm referral but I suspect probably benign

## 2021-05-22 NOTE — Assessment & Plan Note (Signed)
Lab Results  Component Value Date   LDLCALC 68 05/19/2021   Stable, pt to continue current statin lipitor 10

## 2021-07-06 ENCOUNTER — Other Ambulatory Visit: Payer: Self-pay

## 2021-07-06 ENCOUNTER — Encounter: Payer: Self-pay | Admitting: Obstetrics

## 2021-07-06 ENCOUNTER — Ambulatory Visit (INDEPENDENT_AMBULATORY_CARE_PROVIDER_SITE_OTHER): Payer: 59 | Admitting: Obstetrics

## 2021-07-06 ENCOUNTER — Other Ambulatory Visit (HOSPITAL_COMMUNITY)
Admission: RE | Admit: 2021-07-06 | Discharge: 2021-07-06 | Disposition: A | Discharge status: Home / Self Care | Payer: 59 | Source: Ambulatory Visit | Attending: Obstetrics | Admitting: Obstetrics

## 2021-07-06 VITALS — Wt 146.1 lb

## 2021-07-06 DIAGNOSIS — Z01419 Encounter for gynecological examination (general) (routine) without abnormal findings: Secondary | ICD-10-CM

## 2021-07-06 DIAGNOSIS — N898 Other specified noninflammatory disorders of vagina: Secondary | ICD-10-CM

## 2021-07-06 DIAGNOSIS — F17200 Nicotine dependence, unspecified, uncomplicated: Secondary | ICD-10-CM | POA: Diagnosis not present

## 2021-07-06 DIAGNOSIS — Z90711 Acquired absence of uterus with remaining cervical stump: Secondary | ICD-10-CM | POA: Diagnosis not present

## 2021-07-06 NOTE — Progress Notes (Signed)
New patient presents for GYN exam.  ?Endorses vaginal odor x2 months. Denies discharge, vaginal, itching. ?Reports super low libido.  ?

## 2021-07-06 NOTE — Progress Notes (Signed)
? ?Subjective: ? ? ?  ?  ? Cassidy Lee is a 56 y.o. female here for a routine exam.  Current complaints: Vaginal discharge. ? ?Personal health questionnaire:  ?Is patient Ashkenazi Jewish, have a family history of breast and/or ovarian cancer: no ?Is there a family history of uterine cancer diagnosed at age < 22, gastrointestinal cancer, urinary tract cancer, family member who is a Field seismologist syndrome-associated carrier: yes ?Is the patient overweight and hypertensive, family history of diabetes, personal history of gestational diabetes, preeclampsia or PCOS: no ?Is patient over 26, have PCOS,  family history of premature CHD under age 78, diabetes, smoke, have hypertension or peripheral artery disease:  no ?At any time, has a partner hit, kicked or otherwise hurt or frightened you?: no ?Over the past 2 weeks, have you felt down, depressed or hopeless?: no ?Over the past 2 weeks, have you felt little interest or pleasure in doing things?:no ? ? ?Gynecologic History ?No LMP recorded. Patient has had a hysterectomy. ?Contraception: status post hysterectomy ?Last Pap: unknown. Results were: normal ?Last mammogram: ~ 4 years ago. Results were: normal ? ?Obstetric History ?OB History  ?Gravida Para Term Preterm AB Living  ?5 2 0 0 3 0  ?SAB IAB Ectopic Multiple Live Births  ?3 0 0 0 0  ?  ?# Outcome Date GA Lbr Len/2nd Weight Sex Delivery Anes PTL Lv  ?5 Para 1993     CS-LTranv     ?4 Para 1988     CS-LTranv     ?3 SAB           ?2 SAB           ?1 SAB           ? ? ?Past Medical History:  ?Diagnosis Date  ? ALLERGIC RHINITIS 07/21/2008  ? Qualifier: Diagnosis of  By: Jenny Reichmann MD, Hunt Oris   ? Allergy   ? ANXIETY 07/21/2008  ? Qualifier: Diagnosis of  By: Jenny Reichmann MD, Hunt Oris   ? Blood in stool   ? Colon polyps   ? COLONIC POLYPS, HX OF 07/21/2008  ? Qualifier: Diagnosis of  By: Jenny Reichmann MD, Hunt Oris   ? Fainting spell   ? IBS 07/21/2008  ? Qualifier: Diagnosis of  By: Jenny Reichmann MD, Hunt Oris   ? INSOMNIA-SLEEP DISORDER-UNSPEC 07/01/2009  ?  Qualifier: Diagnosis of  By: Jenny Reichmann MD, Hunt Oris   ? Preventative health care 06/19/2013  ? UTI (lower urinary tract infection)   ?  ?Past Surgical History:  ?Procedure Laterality Date  ? ABDOMINAL HYSTERECTOMY  2005  ? foot surgury Left 2005  ?  ? ?Current Outpatient Medications:  ?  atorvastatin (LIPITOR) 10 MG tablet, TAKE 1 TABLET BY MOUTH EVERY DAY, Disp: 90 tablet, Rfl: 3 ?  hyoscyamine (LEVSIN/SL) 0.125 MG SL tablet, Place 1 tablet (0.125 mg total) under the tongue every 4 (four) hours as needed. (Patient taking differently: Place 0.125 mg under the tongue every 4 (four) hours as needed for cramping.), Disp: 30 tablet, Rfl: 0 ?  ALPRAZolam (XANAX) 0.25 MG tablet, Take 1 tablet (0.25 mg total) by mouth at bedtime as needed for anxiety. (Patient not taking: Reported on 07/06/2021), Disp: 90 tablet, Rfl: 1 ?Allergies  ?Allergen Reactions  ? Penicillins Other (See Comments)  ?  Has patient had a PCN reaction causing immediate rash, facial/tongue/throat swelling, SOB or lightheadedness with hypotension: Unknown ?Has patient had a PCN reaction causing severe rash involving mucus membranes or skin necrosis: No ?Has  patient had a PCN reaction that required hospitalization: No ?Has patient had a PCN reaction occurring within the last 10 years: No ?If all of the above answers are "NO", then may proceed with Cephalosporin use. ?Patient states, "It just doesn't work."  ? Propoxyphene N-Acetaminophen Hives  ? Cephalexin Rash  ?  Facial rash  ? Codeine Hives and Rash  ?  ?Social History  ? ?Tobacco Use  ? Smoking status: Some Days  ? Smokeless tobacco: Never  ?Substance Use Topics  ? Alcohol use: Yes  ?  Alcohol/week: 3.0 standard drinks  ?  Types: 3 Cans of beer per week  ?  ?Family History  ?Problem Relation Age of Onset  ? Cancer Other   ? Hypertension Other   ? Diabetes Other   ? Arthritis Mother   ? Cancer Mother   ?     colon and ovarian  ? Diabetes Mother   ? Arthritis Father   ? Hypertension Father   ? Diabetes Father    ? Alcohol abuse Other   ? Cancer Other   ?     colon cancer  ? Colon cancer Maternal Uncle   ? Ovarian cancer Maternal Grandmother   ? Heart disease Maternal Grandfather   ?  ? ? ?Review of Systems ? ?Constitutional: negative for fatigue and weight loss ?Respiratory: negative for cough and wheezing ?Cardiovascular: negative for chest pain, fatigue and palpitations ?Gastrointestinal: negative for abdominal pain and change in bowel habits ?Musculoskeletal:negative for myalgias ?Neurological: negative for gait problems and tremors ?Behavioral/Psych: negative for abusive relationship, depression ?Endocrine: negative for temperature intolerance    ?Genitourinary: positive for vaginal discharge.  negative for abnormal menstrual periods, genital lesions, hot flashes, sexual problems ?Integument/breast: negative for breast lump, breast tenderness, nipple discharge and skin lesion(s) ? ?  ?Objective:  ? ?    ?Wt 146 lb 1.6 oz (66.3 kg)   BMI 28.53 kg/m?  ?General:   Alert and no distress  ?Skin:   no rash or abnormalities  ?Lungs:   clear to auscultation bilaterally  ?Heart:   regular rate and rhythm, S1, S2 normal, no murmur, click, rub or gallop  ?Breasts:   normal without suspicious masses, skin or nipple changes or axillary nodes  ?Abdomen:  normal findings: no organomegaly, soft, non-tender and no hernia  ?Pelvis:  External genitalia: normal general appearance ?Urinary system: urethral meatus normal and bladder without fullness, nontender ?Vaginal: normal without tenderness, induration or masses ?Cervix: absent ?Adnexa: normal bimanual exam ?Uterus: absent  ? ?Lab Review ?Urine pregnancy test ?Labs reviewed yes ?Radiologic studies reviewed yes ? ?.I have spent a total of 20 minutes of face-to-face time, excluding clinical staff time, reviewing notes and preparing to see patient, ordering tests and/or medications, and counseling the patient.  ? ?Assessment:  ? ? 1. Encounter for annual routine gynecological  examination ? ?2. S/P partial hysterectomy ? ?3. Vaginal discharge ?Rx: ?- Cervicovaginal ancillary only ? ?4. Tobacco dependence ?-- cessation recommended ?   ?  ?Plan:  ? ? Education reviewed: calcium supplements, depression evaluation, low fat, low cholesterol diet, safe sex/STD prevention, self breast exams, skin cancer screening, and weight bearing exercise. ?Follow up in: 1 year.  ? ? ? Shelly Bombard, MD ?07/06/2021 7:59 PM  ?

## 2021-07-07 ENCOUNTER — Ambulatory Visit
Admission: RE | Admit: 2021-07-07 | Discharge: 2021-07-07 | Disposition: A | Discharge status: Home / Self Care | Payer: 59 | Source: Ambulatory Visit | Attending: Internal Medicine | Admitting: Internal Medicine

## 2021-07-07 DIAGNOSIS — Z1231 Encounter for screening mammogram for malignant neoplasm of breast: Secondary | ICD-10-CM

## 2021-07-08 LAB — CERVICOVAGINAL ANCILLARY ONLY
Bacterial Vaginitis (gardnerella): NEGATIVE
Candida Glabrata: NEGATIVE
Candida Vaginitis: NEGATIVE
Comment: NEGATIVE
Comment: NEGATIVE
Comment: NEGATIVE

## 2021-07-23 ENCOUNTER — Ambulatory Visit: Payer: 59

## 2021-08-04 ENCOUNTER — Ambulatory Visit (INDEPENDENT_AMBULATORY_CARE_PROVIDER_SITE_OTHER): Payer: 59

## 2021-08-04 DIAGNOSIS — Z23 Encounter for immunization: Secondary | ICD-10-CM

## 2021-08-04 NOTE — Progress Notes (Signed)
After obtaining consent, and per orders of Dr. Jenny Reichmann, injection of 2nd shingrix given by Archie Balboa. Patient tolerated injection in left deltoid and was advised and to report any adverse reaction to me immediately. ? ?

## 2021-10-26 ENCOUNTER — Other Ambulatory Visit: Payer: Self-pay | Admitting: Gastroenterology

## 2021-10-26 ENCOUNTER — Ambulatory Visit
Admission: RE | Admit: 2021-10-26 | Discharge: 2021-10-26 | Disposition: A | Discharge status: Home / Self Care | Payer: 59 | Source: Ambulatory Visit | Attending: Gastroenterology | Admitting: Gastroenterology

## 2021-10-26 DIAGNOSIS — R198 Other specified symptoms and signs involving the digestive system and abdomen: Secondary | ICD-10-CM

## 2021-10-26 DIAGNOSIS — R1084 Generalized abdominal pain: Secondary | ICD-10-CM

## 2022-05-19 ENCOUNTER — Telehealth: Payer: 59 | Admitting: Family Medicine

## 2022-05-23 ENCOUNTER — Encounter: Payer: 59 | Admitting: Internal Medicine

## 2022-06-06 ENCOUNTER — Other Ambulatory Visit (INDEPENDENT_AMBULATORY_CARE_PROVIDER_SITE_OTHER): Payer: 59

## 2022-06-06 DIAGNOSIS — E7800 Pure hypercholesterolemia, unspecified: Secondary | ICD-10-CM

## 2022-06-06 DIAGNOSIS — R739 Hyperglycemia, unspecified: Secondary | ICD-10-CM | POA: Diagnosis not present

## 2022-06-06 DIAGNOSIS — E538 Deficiency of other specified B group vitamins: Secondary | ICD-10-CM

## 2022-06-06 DIAGNOSIS — E559 Vitamin D deficiency, unspecified: Secondary | ICD-10-CM

## 2022-06-06 LAB — HEPATIC FUNCTION PANEL
ALT: 13 U/L (ref 0–35)
AST: 14 U/L (ref 0–37)
Albumin: 4.3 g/dL (ref 3.5–5.2)
Alkaline Phosphatase: 66 U/L (ref 39–117)
Bilirubin, Direct: 0.1 mg/dL (ref 0.0–0.3)
Total Bilirubin: 0.6 mg/dL (ref 0.2–1.2)
Total Protein: 7 g/dL (ref 6.0–8.3)

## 2022-06-06 LAB — CBC WITH DIFFERENTIAL/PLATELET
Basophils Absolute: 0.1 10*3/uL (ref 0.0–0.1)
Basophils Relative: 1.1 % (ref 0.0–3.0)
Eosinophils Absolute: 0.1 10*3/uL (ref 0.0–0.7)
Eosinophils Relative: 2.2 % (ref 0.0–5.0)
HCT: 37.1 % (ref 36.0–46.0)
Hemoglobin: 12.6 g/dL (ref 12.0–15.0)
Lymphocytes Relative: 21.1 % (ref 12.0–46.0)
Lymphs Abs: 1.2 10*3/uL (ref 0.7–4.0)
MCHC: 34 g/dL (ref 30.0–36.0)
MCV: 86 fl (ref 78.0–100.0)
Monocytes Absolute: 0.4 10*3/uL (ref 0.1–1.0)
Monocytes Relative: 7 % (ref 3.0–12.0)
Neutro Abs: 3.9 10*3/uL (ref 1.4–7.7)
Neutrophils Relative %: 68.6 % (ref 43.0–77.0)
Platelets: 267 10*3/uL (ref 150.0–400.0)
RBC: 4.31 Mil/uL (ref 3.87–5.11)
RDW: 13.3 % (ref 11.5–15.5)
WBC: 5.7 10*3/uL (ref 4.0–10.5)

## 2022-06-06 LAB — BASIC METABOLIC PANEL
BUN: 16 mg/dL (ref 6–23)
CO2: 28 mEq/L (ref 19–32)
Calcium: 9.7 mg/dL (ref 8.4–10.5)
Chloride: 102 mEq/L (ref 96–112)
Creatinine, Ser: 0.72 mg/dL (ref 0.40–1.20)
GFR: 93.21 mL/min (ref >60.00)
Glucose, Bld: 85 mg/dL (ref 70–99)
Potassium: 3.6 mEq/L (ref 3.5–5.1)
Sodium: 139 mEq/L (ref 135–145)

## 2022-06-06 LAB — HEMOGLOBIN A1C: Hgb A1c MFr Bld: 5.7 % (ref 4.6–6.5)

## 2022-06-06 LAB — VITAMIN B12: Vitamin B-12: 262 pg/mL (ref 211–911)

## 2022-06-06 LAB — LIPID PANEL
Cholesterol: 199 mg/dL (ref 0–200)
HDL: 81 mg/dL (ref >39.00)
LDL Cholesterol: 86 mg/dL (ref 0–99)
NonHDL: 117.63
Total CHOL/HDL Ratio: 2
Triglycerides: 156 mg/dL — ABNORMAL HIGH (ref 0.0–149.0)
VLDL: 31.2 mg/dL (ref 0.0–40.0)

## 2022-06-06 LAB — TSH: TSH: 1.54 u[IU]/mL (ref 0.35–5.50)

## 2022-06-06 LAB — VITAMIN D 25 HYDROXY (VIT D DEFICIENCY, FRACTURES): VITD: 9.59 ng/mL — ABNORMAL LOW (ref 30.00–100.00)

## 2022-06-07 LAB — URINALYSIS, ROUTINE W REFLEX MICROSCOPIC
Bilirubin Urine: NEGATIVE
Hgb urine dipstick: NEGATIVE
Ketones, ur: NEGATIVE
Nitrite: NEGATIVE
RBC / HPF: NONE SEEN (ref 0–?)
Specific Gravity, Urine: 1.015 (ref 1.000–1.030)
Total Protein, Urine: NEGATIVE
Urine Glucose: NEGATIVE
Urobilinogen, UA: 0.2 (ref 0.0–1.0)
pH: 6.5 (ref 5.0–8.0)

## 2022-06-08 ENCOUNTER — Ambulatory Visit (INDEPENDENT_AMBULATORY_CARE_PROVIDER_SITE_OTHER): Payer: 59 | Admitting: Internal Medicine

## 2022-06-08 VITALS — BP 110/62 | HR 84 | Temp 97.6°F | Ht 60.0 in | Wt 148.1 lb

## 2022-06-08 DIAGNOSIS — E538 Deficiency of other specified B group vitamins: Secondary | ICD-10-CM

## 2022-06-08 DIAGNOSIS — E559 Vitamin D deficiency, unspecified: Secondary | ICD-10-CM

## 2022-06-08 DIAGNOSIS — J309 Allergic rhinitis, unspecified: Secondary | ICD-10-CM | POA: Diagnosis not present

## 2022-06-08 DIAGNOSIS — Z1159 Encounter for screening for other viral diseases: Secondary | ICD-10-CM

## 2022-06-08 DIAGNOSIS — F17200 Nicotine dependence, unspecified, uncomplicated: Secondary | ICD-10-CM | POA: Diagnosis not present

## 2022-06-08 DIAGNOSIS — R739 Hyperglycemia, unspecified: Secondary | ICD-10-CM | POA: Diagnosis not present

## 2022-06-08 DIAGNOSIS — L989 Disorder of the skin and subcutaneous tissue, unspecified: Secondary | ICD-10-CM

## 2022-06-08 DIAGNOSIS — I700 Atherosclerosis of aorta: Secondary | ICD-10-CM

## 2022-06-08 DIAGNOSIS — E7800 Pure hypercholesterolemia, unspecified: Secondary | ICD-10-CM

## 2022-06-08 DIAGNOSIS — Z0001 Encounter for general adult medical examination with abnormal findings: Secondary | ICD-10-CM

## 2022-06-08 MED ORDER — PREDNISONE 10 MG PO TABS: ORAL_TABLET | ORAL | 0 refills | Status: DC

## 2022-06-08 NOTE — Patient Instructions (Addendum)
Please take all new medication as prescribed - the prednisone  Please continue all other medications as before, and refills have been done if requested.  Please have the pharmacy call with any other refills you may need.  Please continue your efforts at being more active, low cholesterol diet, and weight control.  You are otherwise up to date with prevention measures today.  Please keep your appointments with your specialists as you may have planned  Please make an Appointment to return for your 1 year visit, or sooner if needed, with Lab testing by Appointment as well, to be done about 3-5 days before at the [facility] (so this is for TWO appointments - please see the scheduling desk as you leave)

## 2022-06-08 NOTE — Progress Notes (Signed)
Patient ID: Cassidy Lee, female   DOB: [birth year], 57 y.o.   MRN: [id number]         Chief Complaint:: wellness exam and allergies, recent URI, aortic atherosclerosis, hld, hyperglycemia, skin lesion, smoker, low vit d       HPI:  Cassidy Lee is a 57 y.o. female here for wellness exam; declines covid booster, o/w up to date                        Also had recent URI and cough with marked fatigue, spent 1.5 wks in bed earlier this month.  Still some sinus congestion after amoxil and mucinex course.   Does have several wks ongoing nasal allergy symptoms with clearish congestion, itch and sneezing, without fever, pain, ST, cough, swelling or wheezing.  Also has new enlarging dark skin lesion to neck, asks for derm referral.  Still smoking, not ready to quit.  Pt denies chest pain, increased sob or doe, wheezing, orthopnea, PND, increased LE swelling, palpitations, dizziness or syncope.   Pt denies polydipsia, polyuria, or new focal neuro s/s.      Wt Readings from Last 3 Encounters:  06/08/22 148 lb 2 oz (67.2 kg)  07/06/21 146 lb 1.6 oz (66.3 kg)  05/21/21 144 lb 8 oz (65.5 kg)   BP Readings from Last 3 Encounters:  06/08/22 110/62  05/21/21 136/80  11/28/19 120/78   Immunization History  Administered Date(s) Administered   Influenza Inj Mdck Quad Pf 01/15/2019   Influenza,inj,Quad PF,6+ Mos 03/24/2018, 01/15/2019   Influenza-Unspecified 03/30/2018, 01/09/2021, 01/19/2022   PFIZER(Purple Top)SARS-COV-2 Vaccination 10/14/2019, 11/04/2019   Td 07/21/2008   Tdap 05/10/2016   Zoster Recombinat (Shingrix) 05/21/2021, 08/04/2021   Health Maintenance Due  Topic Date Due   COVID-19 Vaccine (3 - 2023-24 season) 12/10/2021      Past Medical History:  Diagnosis Date   ALLERGIC RHINITIS 07/21/2008   Qualifier: Diagnosis of  By: Jenny Reichmann MD, Hunt Oris    Allergy    ANXIETY 07/21/2008   Qualifier: Diagnosis of  By: Jenny Reichmann MD, Hunt Oris    Blood in stool    Colon polyps    COLONIC POLYPS, HX OF  07/21/2008   Qualifier: Diagnosis of  By: Jenny Reichmann MD, Hunt Oris    Fainting spell    IBS 07/21/2008   Qualifier: Diagnosis of  By: Jenny Reichmann MD, Hunt Oris    INSOMNIA-SLEEP [place] 07/01/2009   Qualifier: Diagnosis of  By: Jenny Reichmann MD, Hunt Oris    Preventative health care 06/19/2013   UTI (lower urinary tract infection)    Past Surgical History:  Procedure Laterality Date   ABDOMINAL HYSTERECTOMY  2005   foot surgury Left 2005    reports that she has been smoking. She has never used smokeless tobacco. She reports current alcohol use of about 3.0 standard drinks of alcohol per week. She reports that she does not use drugs. family history includes Alcohol abuse in an other family member; Arthritis in her father and mother; Cancer in her mother and other family members; Colon cancer in her maternal uncle; Diabetes in her father, mother, and another family member; Heart disease in her maternal grandfather; Hypertension in her father and another family member; Ovarian cancer in her maternal grandmother. Allergies  Allergen Reactions   Penicillins Other (See Comments)    Has patient had a PCN reaction causing immediate rash, facial/tongue/throat swelling, SOB or lightheadedness with hypotension: Unknown Has patient had a PCN reaction causing  severe rash involving mucus membranes or skin necrosis: No Has patient had a PCN reaction that required hospitalization: No Has patient had a PCN reaction occurring within the last 10 years: No If all of the above answers are "NO", then may proceed with Cephalosporin use. Patient states, "It just doesn't work."   Propoxyphene N-Acetaminophen Hives   Cephalexin Rash    Facial rash   Codeine Hives and Rash   Current Outpatient Medications on File Prior to Visit  Medication Sig Dispense Refill   ALPRAZolam (XANAX) 0.25 MG tablet Take 1 tablet (0.25 mg total) by mouth at bedtime as needed for anxiety. 90 tablet 1   atorvastatin (LIPITOR) 10 MG tablet TAKE 1 TABLET BY  MOUTH EVERY DAY 90 tablet 3   hyoscyamine (LEVSIN/SL) 0.125 MG SL tablet Place 1 tablet (0.125 mg total) under the tongue every 4 (four) hours as needed. (Patient taking differently: Place 0.125 mg under the tongue every 4 (four) hours as needed for cramping.) 30 tablet 0   No current facility-administered medications on file prior to visit.        ROS:  All others reviewed and negative.  Objective        PE:  BP 110/62   Pulse 84   Temp 97.6 F (36.4 C) (Temporal)   Ht 5' (1.524 m)   Wt 148 lb 2 oz (67.2 kg)   SpO2 98%   BMI 28.93 kg/m                 Constitutional: Pt appears in NAD               HENT: Head: NCAT.                Right Ear: External ear normal.                 Left Ear: External ear normal. Bilat tm's with mild erythema.  Max sinus areas non tender.  Pharynx with mild erythema, no exudate               Eyes: . Pupils are equal, round, and reactive to light. Conjunctivae and EOM are normal               Nose: without d/c or deformity               Neck: Neck supple. Gross normal ROM               Cardiovascular: Normal rate and regular rhythm.                 Pulmonary/Chest: Effort normal and breath sounds without rales or wheezing.                Abd:  Soft, NT, ND, + BS, no organomegaly               Neurological: Pt is alert. At baseline orientation, motor grossly intact               Skin: Skin is warm. No rashes, no other new lesions, LE edema - none               Psychiatric: Pt behavior is normal without agitation   Micro: none  Cardiac tracings I have personally interpreted today:  none  Pertinent Radiological findings (summarize): none   Lab Results  Component Value Date   WBC 5.7 06/06/2022   HGB 12.6 06/06/2022   HCT 37.1 06/06/2022   PLT  267.0 06/06/2022   GLUCOSE 85 06/06/2022   CHOL 199 06/06/2022   TRIG 156.0 (H) 06/06/2022   HDL 81.00 06/06/2022   LDLCALC 86 06/06/2022   ALT 13 06/06/2022   AST 14 06/06/2022   NA 139 06/06/2022    K 3.6 06/06/2022   CL 102 06/06/2022   CREATININE 0.72 06/06/2022   BUN 16 06/06/2022   CO2 28 06/06/2022   TSH 1.54 06/06/2022   INR 1.0 06/20/2013   HGBA1C 5.7 06/06/2022   Assessment/Plan:  Cassidy Lee is a 57 y.o. White or Caucasian [1] female with  has a past medical history of ALLERGIC RHINITIS (07/21/2008), Allergy, ANXIETY (07/21/2008), Blood in stool, Colon polyps, COLONIC POLYPS, HX OF (07/21/2008), Fainting spell, IBS (07/21/2008), INSOMNIA-SLEEP DISORDER-UNSPEC (07/01/2009), Preventative health care (06/19/2013), and UTI (lower urinary tract infection).  Encounter for well adult exam with abnormal findings Age and sex appropriate education and counseling updated with regular exercise and diet Referrals for preventative services - none needed Immunizations addressed - declines colonoscopy Smoking counseling  - none needed Evidence for depression or other mood disorder - none significant Most recent labs reviewed. I have personally reviewed and have noted: 1) the patient's medical and social history 2) The patient's current medications and supplements 3) The patient's height, weight, and BMI have been recorded in the chart   Allergic rhinitis Mild to mod likely seasonal flare, for prednisone taper course,  to f/u any worsening symptoms or concerns   Aortic atherosclerosis (HCC) Pt to continue excerciese, low chol diet, and lipitor 10 mg qd  HLD (hyperlipidemia) Lab Results  Component Value Date   LDLCALC 86 06/06/2022   Uncontrolled, goal ldl < 70, pt to continue current statin lipitor 10 mg and lower chol diet as declines change today  Hyperglycemia Lab Results  Component Value Date   HGBA1C 5.7 06/06/2022   Stable, pt to continue current medical treatment  - diet, wt control   Skin lesion Recent worsening - for derm referral  Smoker Pt counsled to quit, pt not ready  Vitamin D deficiency Last vitamin D Lab Results  Component Value Date   VD25OH 9.59  (L) 06/06/2022   Low, to start oral replacement  Followup: Return in about 1 year (around 06/09/2023).  Cathlean Cower, MD 06/11/2022 5:48 PM [facility]

## 2022-06-09 LAB — HEPATITIS C ANTIBODY: Hepatitis C Ab: NONREACTIVE

## 2022-06-11 ENCOUNTER — Encounter: Payer: Self-pay | Admitting: Internal Medicine

## 2022-06-11 NOTE — Assessment & Plan Note (Signed)
Age and sex appropriate education and counseling updated with regular exercise and diet Referrals for preventative services - none needed Immunizations addressed - declines colonoscopy Smoking counseling  - none needed Evidence for depression or other mood disorder - none significant Most recent labs reviewed. I have personally reviewed and have noted: 1) the patient's medical and social history 2) The patient's current medications and supplements 3) The patient's height, weight, and BMI have been recorded in the chart

## 2022-06-11 NOTE — Assessment & Plan Note (Signed)
Pt counsled to quit, pt not ready °

## 2022-06-11 NOTE — Assessment & Plan Note (Signed)
Recent worsening - for derm referral

## 2022-06-11 NOTE — Assessment & Plan Note (Signed)
Lab Results  Component Value Date   HGBA1C 5.7 06/06/2022   Stable, pt to continue current medical treatment  - diet, wt control

## 2022-06-11 NOTE — Addendum Note (Signed)
Addended by: Biagio Borg on: 06/11/2022 05:51 PM   Modules accepted: Orders, Level of Service

## 2022-06-11 NOTE — Assessment & Plan Note (Signed)
Last vitamin D Lab Results  Component Value Date   VD25OH 9.59 (L) 06/06/2022   Low, to start oral replacement

## 2022-06-11 NOTE — Assessment & Plan Note (Signed)
Mild to mod likely seasonal flare, for prednisone taper course,  to f/u any worsening symptoms or concerns

## 2022-06-11 NOTE — Assessment & Plan Note (Signed)
Lab Results  Component Value Date   LDLCALC 86 06/06/2022   Uncontrolled, goal ldl < 70, pt to continue current statin lipitor 10 mg and lower chol diet as declines change today

## 2022-06-11 NOTE — Assessment & Plan Note (Signed)
Pt to continue excerciese, low chol diet, and lipitor 10 mg qd

## 2022-07-17 ENCOUNTER — Telehealth: Payer: 59 | Admitting: Family Medicine

## 2022-07-17 DIAGNOSIS — L237 Allergic contact dermatitis due to plants, except food: Secondary | ICD-10-CM | POA: Diagnosis not present

## 2022-07-17 MED ORDER — PREDNISONE 10 MG (21) PO TBPK: ORAL_TABLET | ORAL | 0 refills | Status: AC

## 2022-07-17 NOTE — Progress Notes (Signed)
Virtual Visit Consent   Cassidy Lee, you are scheduled for a virtual visit with a [facility] provider today. Just as with appointments in the office, your consent must be obtained to participate. Your consent will be active for this visit and any virtual visit you may have with one of our providers in the next 365 days. If you have a MyChart account, a copy of this consent can be sent to you electronically.  As this is a virtual visit, video technology does not allow for your provider to perform a traditional examination. This may limit your provider's ability to fully assess your condition. If your provider identifies any concerns that need to be evaluated in person or the need to arrange testing (such as labs, EKG, etc.), we will make arrangements to do so. Although advances in technology are sophisticated, we cannot ensure that it will always work on either your end or our end. If the connection with a video visit is poor, the visit may have to be switched to a telephone visit. With either a video or telephone visit, we are not always able to ensure that we have a secure connection.  By engaging in this virtual visit, you consent to the provision of healthcare and authorize for your insurance to be billed (if applicable) for the services provided during this visit. Depending on your insurance coverage, you may receive a charge related to this service.  I need to obtain your verbal consent now. Are you willing to proceed with your visit today? Cassidy Lee has provided verbal consent on 07/17/2022 for a virtual visit (video or telephone). Georgana Curio, FNP  Date: 07/17/2022 10:54 AM  Virtual Visit via Video Note   I, Georgana Curio, connected with  Cassidy Lee  ([id number], [birth year]) on 07/17/22 at 11:00 AM EDT by a video-enabled telemedicine application and verified that I am speaking with the correct person using two identifiers.  Location: Patient: Virtual Visit Location Patient:  Home Provider: Virtual Visit Location Provider: Home Office   I discussed the limitations of evaluation and management by telemedicine and the availability of in person appointments. The patient expressed understanding and agreed to proceed.    History of Present Illness: Cassidy Lee is a 57 y.o. who identifies as a female who was assigned female at birth, and is being seen today for rash over arms, chest and neck worsening. She has been working outside and feels it is poison oak or sumac. It is red, weeping and itching. No fever. 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   HLD (hyperlipidemia) 11/07/2017   Aortic atherosclerosis 11/07/2017   Facial rash 11/07/2017   Low back pain 11/07/2017   Hyperglycemia 11/07/2017   Family history of malignant neoplasm of gastrointestinal tract 08/06/2013   Encounter for well adult exam with abnormal findings 06/19/2013   Smoker 06/19/2013   Hematochezia 06/19/2013   INSOMNIA-SLEEP DISORDER-UNSPEC 07/01/2009   Anxiety state 07/21/2008   Allergic rhinitis 07/21/2008   IBS 07/21/2008   COLONIC POLYPS, HX OF 07/21/2008    Allergies:  Allergies  Allergen Reactions   Penicillins Other (See Comments)    Has patient had a PCN reaction causing immediate rash, facial/tongue/throat swelling, SOB or lightheadedness with hypotension: Unknown Has patient had a PCN reaction causing severe rash involving mucus membranes or skin necrosis:  No Has patient had a PCN reaction that required hospitalization: No Has patient had a PCN reaction occurring within the last 10 years: No If all of the above answers are "NO", then may proceed with Cephalosporin use. Patient states, "It just doesn't work."   Propoxyphene N-Acetaminophen Hives   Cephalexin Rash    Facial rash   Codeine Hives and Rash   Medications:   Current Outpatient Medications:    predniSONE (STERAPRED UNI-PAK 21 TAB) 10 MG (21) TBPK tablet, Prednisone 10 mg- 12 day dose pack as directed., Disp: 1 each, Rfl: 0   ALPRAZolam (XANAX) 0.25 MG tablet, Take 1 tablet (0.25 mg total) by mouth at bedtime as needed for anxiety., Disp: 90 tablet, Rfl: 1   atorvastatin (LIPITOR) 10 MG tablet, TAKE 1 TABLET BY MOUTH EVERY DAY, Disp: 90 tablet, Rfl: 3   hyoscyamine (LEVSIN/SL) 0.125 MG SL tablet, Place 1 tablet (0.125 mg total) under the tongue every 4 (four) hours as needed. (Patient taking differently: Place 0.125 mg under the tongue every 4 (four) hours as needed for cramping.), Disp: 30 tablet, Rfl: 0   predniSONE (DELTASONE) 10 MG tablet, 3 tabs by mouth per day for 3 days,2tabs per day for 3 days,1tab per day for 3 days, Disp: 18 tablet, Rfl: 0  Observations/Objective: Patient is well-developed, well-nourished in no acute distress.  Resting comfortably  at home.  Head is normocephalic, atraumatic.  No labored breathing.  Speech is clear and coherent with logical content.  Patient is alert and oriented at baseline.    Assessment and Plan: 1. Allergic contact dermatitis due to plants, except food  Increase fluids, keep areas clean and dry, continue benadryl, hydrocortisone, ivy dry.   Follow Up Instructions: I discussed the assessment and treatment plan with the patient. The patient was provided an opportunity to ask questions and all were answered. The patient agreed with the plan and demonstrated an understanding of the instructions.  A copy of instructions were sent to the patient via MyChart unless otherwise noted below.     The patient was advised to call back or seek an in-person evaluation if the symptoms worsen or if the condition fails to improve as anticipated.  Time:  I spent 10 minutes with the patient via telehealth technology discussing the above problems/concerns.    Georgana Curio, FNP

## 2022-07-17 NOTE — Patient Instructions (Signed)
Contact Dermatitis Dermatitis is redness, soreness, and swelling (inflammation) of the skin. Contact dermatitis is a reaction to certain substances that touch the skin. There are two types of this condition: Irritant contact dermatitis. This is the most common type. It happens when something irritates your skin, such as when your hands get dry from washing them too often with soap. You can get this type of reaction even if you have not been exposed to the irritant before. Allergic contact dermatitis. This type is caused by a substance that you are allergic to, such as poison ivy. It occurs when you have been exposed to the substance (allergen) and form a sensitivity to it. In some cases, the reaction may start soon after your first exposure to the allergen. In other cases, it may not start until you are exposed to the allergen again. It may then occur every time you are exposed to the allergen in the future. What are the causes? Irritant contact dermatitis is often caused by exposure to: Makeup. Soaps, detergents, and bleaches. Acids. Metal salts, such as nickel. Allergic contact dermatitis is often caused by exposure to: Poisonous plants. Chemicals. Jewelry. Latex. Medicines. Preservatives in products, such as clothes. What increases the risk? You are more likely to get this condition if you have: A job that exposes you to irritants or allergens. Certain medical conditions. These include asthma and eczema. What are the signs or symptoms? Symptoms of this condition may occur in any place on your body that has been touched by the irritant. Symptoms include: Dryness, flaking, or cracking. Redness. Itching. Pain or a burning feeling. Blisters. Drainage of small amounts of blood or clear fluid from skin cracks. With allergic contact dermatitis, there may also be swelling in areas such as the eyelids, mouth, or genitals. How is this diagnosed? This condition is diagnosed with a medical  history and physical exam. A patch skin test may be done to help figure out the cause. If the condition is related to your job, you may need to see an expert in health problems in the workplace (occupational medicine specialist). How is this treated? This condition is treated by staying away from the cause of the reaction and protecting your skin from further contact. Treatment may also include: Steroid creams or ointments. Steroid medicines may need be taken by mouth (orally) in more severe cases. Antibiotics or medicines applied to the skin to kill bacteria (antibacterial ointments). These may be needed if a skin infection is present. Antihistamines. These may be taken orally or put on as a lotion to ease itching. A bandage (dressing). Follow these instructions at home: Skin care Moisturize your skin as needed. Put cool, wet cloths (cool compresses) on the affected areas. Try applying baking soda paste to your skin. Stir water into baking soda until it has the consistency of a paste. Do not scratch your skin. Avoid friction to the affected area. Avoid the use of soaps, perfumes, and dyes. Check the affected areas every day for signs of infection. Check for: More redness, swelling, or pain. More fluid or blood. Warmth. Pus or a bad smell. Medicines Take or apply over-the-counter and prescription medicines only as told by your health care provider. If you were prescribed antibiotics, take or apply them as told by your health care provider. Do not stop using the antibiotic even if you start to feel better. Bathing Try taking a bath with: Epsom salts. Follow the instructions on the packaging. You can get these at your local pharmacy   or grocery store. Baking soda. Pour a small amount into the bath as told by your health care provider. Colloidal oatmeal. Follow the instructions on the packaging. You can get this at your local pharmacy or grocery store. Bathe less often. This may mean bathing  every other day. Bathe in lukewarm water. Avoid using hot water. Bandage care If you were given a dressing, change it as told by your health care provider. Wash your hands with soap and water for at least 20 seconds before and after you change your dressing. If soap and water are not available, use hand sanitizer. General instructions Avoid the substance that caused your reaction. If you do not know what caused it, keep a journal to try to track what caused it. Write down: What you eat and drink. What cosmetics you use. What you wear in the affected area. This includes jewelry. Contact a health care provider if: Your condition does not get better with treatment. Your condition gets worse. You have any signs of infection. You have a fever. You have new symptoms. Your bone or joint under the affected area becomes painful after the skin has healed. Get help right away if: You notice red streaks coming from the affected area. The affected area turns darker. You have trouble breathing. This information is not intended to replace advice given to you by your health care provider. Make sure you discuss any questions you have with your health care provider. Document Revised: 10/01/2021 Document Reviewed: 10/01/2021 Elsevier Patient Education  2023 [employer]  

## 2022-08-02 ENCOUNTER — Encounter: Payer: Self-pay | Admitting: Dermatology

## 2022-08-02 ENCOUNTER — Ambulatory Visit: Payer: 59 | Admitting: Dermatology

## 2022-08-02 VITALS — BP 105/71

## 2022-08-02 DIAGNOSIS — W908XXA Exposure to other nonionizing radiation, initial encounter: Secondary | ICD-10-CM

## 2022-08-02 DIAGNOSIS — L570 Actinic keratosis: Secondary | ICD-10-CM | POA: Diagnosis not present

## 2022-08-02 DIAGNOSIS — L720 Epidermal cyst: Secondary | ICD-10-CM

## 2022-08-02 DIAGNOSIS — Z1283 Encounter for screening for malignant neoplasm of skin: Secondary | ICD-10-CM | POA: Diagnosis not present

## 2022-08-02 DIAGNOSIS — L578 Other skin changes due to chronic exposure to nonionizing radiation: Secondary | ICD-10-CM

## 2022-08-02 DIAGNOSIS — L821 Other seborrheic keratosis: Secondary | ICD-10-CM | POA: Diagnosis not present

## 2022-08-02 DIAGNOSIS — X32XXXA Exposure to sunlight, initial encounter: Secondary | ICD-10-CM

## 2022-08-02 DIAGNOSIS — L814 Other melanin hyperpigmentation: Secondary | ICD-10-CM

## 2022-08-02 DIAGNOSIS — D225 Melanocytic nevi of trunk: Secondary | ICD-10-CM

## 2022-08-02 DIAGNOSIS — D1801 Hemangioma of skin and subcutaneous tissue: Secondary | ICD-10-CM

## 2022-08-02 NOTE — Progress Notes (Unsigned)
New Patient Visit   Subjective  Cassidy Lee is a 57 y.o. female who presents for the following: Skin Cancer Screening and Full Body Skin Exam  The patient presents for Total-Body Skin Exam (TBSE) for skin cancer screening and mole check. The patient has spots, moles and lesions to be evaluated, some may be new or changing and the patient has concerns that these could be cancer.  She does not have a personal or family history of skin cancer. She has a dry bump at the left temporal hairline x 1 year. She says it will get a scab if rubbed or scratched.   The following portions of the chart were reviewed this encounter and updated as appropriate: medications, allergies, medical history  Review of Systems:  No other skin or systemic complaints except as noted in HPI or Assessment and Plan.  Objective  Well appearing patient in no apparent distress; mood and affect are within normal limits.  A full examination was performed including scalp, head, eyes, ears, nose, lips, neck, chest, axillae, abdomen, back, buttocks, bilateral upper extremities, bilateral lower extremities, hands, feet, fingers, toes, fingernails, and toenails. All findings within normal limits unless otherwise noted below.   Relevant physical exam findings are noted in the Assessment and Plan.    Assessment & Plan   LENTIGINES, SEBORRHEIC KERATOSES, HEMANGIOMAS - Benign normal skin lesions - Benign-appearing - Call for any changes  MELANOCYTIC NEVI - Tan-brown and/or pink-flesh-colored symmetric macules and papules - Benign appearing on exam today - Observation - Call clinic for new or changing moles - Recommend daily use of broad spectrum spf 30+ sunscreen to sun-exposed areas.   ACTINIC DAMAGE - Chronic condition, secondary to cumulative UV/sun exposure - diffuse scaly erythematous macules with underlying dyspigmentation - Recommend daily broad spectrum sunscreen SPF 30+ to sun-exposed areas, reapply every 2  hours as needed.  - Staying in the shade or wearing long sleeves, sun glasses (UVA+UVB protection) and wide brim hats (4-inch brim around the entire circumference of the hat) are also recommended for sun protection.  - Call for new or changing lesions.   ACTINIC KERATOSIS Exam: Erythematous thin papules/macules with gritty scale  Actinic keratoses are precancerous spots that appear secondary to cumulative UV radiation exposure/sun exposure over time. They are chronic with expected duration over 1 year. A portion of actinic keratoses will progress to squamous cell carcinoma of the skin. It is not possible to reliably predict which spots will progress to skin cancer and so treatment is recommended to prevent development of skin cancer.  Recommend daily broad spectrum sunscreen SPF 30+ to sun-exposed areas, reapply every 2 hours as needed.  Recommend staying in the shade or wearing long sleeves, sun glasses (UVA+UVB protection) and wide brim hats (4-inch brim around the entire circumference of the hat). Call for new or changing lesions.  Treatment Plan:  Prior to procedure, discussed risks of blister formation, small wound, skin dyspigmentation, or rare scar following cryotherapy. Recommend Vaseline ointment to treated areas while healing.  Destruction Procedure Note Destruction method: cryotherapy   Informed consent: discussed and consent obtained   Lesion destroyed using liquid nitrogen: Yes   Outcome: patient tolerated procedure well with no complications   Post-procedure details: wound care instructions given   Locations: 1 # of Lesions Treated: left temporal hairline  EPIDERMAL INCLUSION CYST Exam: Subcutaneous nodule at left axilla  Benign-appearing. Exam most consistent with an epidermal inclusion cyst. Discussed that a cyst is a benign growth that can grow  over time and sometimes get irritated or inflamed. Recommend observation if it is not bothersome. Discussed option of surgical  excision to remove it if it is growing, symptomatic, or other changes noted. Please call for new or changing lesions so they can be evaluated.      SKIN CANCER SCREENING PERFORMED TODAY.      No follow-ups on file.  Jaclynn Guarneri, CMA, am acting as scribe for Langston Reusing, MD.   Documentation: I have reviewed the above documentation for accuracy and completeness, and I agree with the above.  Langston Reusing, MD

## 2022-08-02 NOTE — Patient Instructions (Addendum)
Due to recent changes in healthcare laws, you may see results of your pathology and/or laboratory studies on MyChart before the doctors have had a chance to review them. We understand that in some cases there may be results that are confusing or concerning to you. Please understand that not all results are received at the same time and often the doctors may need to interpret multiple results in order to provide you with the best plan of care or course of treatment. Therefore, we ask that you please give [place] 2 business days to thoroughly review all your results before contacting the office for clarification. Should we see a critical lab result, you will be contacted sooner.   If You Need Anything After Your Visit  If you have any questions or concerns for your doctor, please call our main line at [phone] If no one answers, please leave a voicemail as directed and we will return your call as soon as possible. Messages left after 4 pm will be answered the following business day.   You may also send [place] a message via MyChart. We typically respond to MyChart messages within 1-2 business days.  For prescription refills, please ask your pharmacy to contact our office. Our fax number is [phone].  If you have an urgent issue when the clinic is closed that cannot wait until the next business day, you can page your doctor at the number below.    Please note that while we do our best to be available for urgent issues outside of office hours, we are not available 24/7.   If you have an urgent issue and are unable to reach [place], you may choose to seek medical care at your doctor's office, retail clinic, urgent care center, or emergency room.  If you have a medical emergency, please immediately call 911 or go to the emergency department. In the event of inclement weather, please call our main line at [phone] for an update on the status of any delays or closures.  Dermatology Medication Tips: Please  keep the boxes that topical medications come in in order to help keep track of the instructions about where and how to use these. Pharmacies typically print the medication instructions only on the boxes and not directly on the medication tubes.   If your Skin Education :   I counseled the patient regarding the following: Sun screen (SPF 30 or greater) should be applied during peak UV exposure (between 10am and 2pm) and reapplied after exercise or swimming.  The ABCDEs of melanoma were reviewed with the patient, and the importance of monthly self-examination of moles was emphasized. Should any moles change in shape or color, or itch, bleed or burn, pt will contact our office for evaluation sooner then their interval appointment.  Plan: Sunscreen Recommendations I recommended a broad spectrum sunscreen with a SPF of 30 or higher. I explained that SPF 30 sunscreens block approximately 97 percent of the sun's harmful rays. Sunscreens should be applied at least 15 minutes prior to expected sun exposure and then every 2 hours after that as long as sun exposure continues. If swimming or exercising sunscreen should be reapplied every 45 minutes to an hour after getting wet or sweating. One ounce, or the equivalent of a shot glass full of sunscreen, is adequate to protect the skin not covered by a bathing suit. I also recommended a lip balm with a sunscreen as well. Sun protective clothing can be used in lieu of sunscreen but must be  worn the entire time you are exposed to the sun's rays. Cryotherapy Aftercare  Wash gently with soap and water everyday.   Apply Vaseline and Band-Aid daily until healed. medication is too expensive, please contact our office at [phone] or send [place] a message through MyChart.   We are unable to tell what your co-pay for medications will be in advance as this is different depending on your insurance coverage. However, we may be able to find a substitute medication at lower cost  or fill out paperwork to get insurance to cover a needed medication.   If a prior authorization is required to get your medication covered by your insurance company, please allow [place] 1-2 business days to complete this process.  Drug prices often vary depending on where the prescription is filled and some pharmacies may offer cheaper prices.  The website www.goodrx.com contains coupons for medications through different pharmacies. The prices here do not account for what the cost may be with help from insurance (it may be cheaper with your insurance), but the website can give you the price if you did not use any insurance.  - You can print the associated coupon and take it with your prescription to the pharmacy.  - You may also stop by our office during regular business hours and pick up a GoodRx coupon card.  - If you need your prescription sent electronically to a different pharmacy, notify our office through [facility] or by phone at [phone]

## 2023-06-06 ENCOUNTER — Other Ambulatory Visit (INDEPENDENT_AMBULATORY_CARE_PROVIDER_SITE_OTHER): Payer: 59

## 2023-06-06 DIAGNOSIS — E559 Vitamin D deficiency, unspecified: Secondary | ICD-10-CM

## 2023-06-06 DIAGNOSIS — R739 Hyperglycemia, unspecified: Secondary | ICD-10-CM | POA: Diagnosis not present

## 2023-06-06 DIAGNOSIS — E538 Deficiency of other specified B group vitamins: Secondary | ICD-10-CM | POA: Diagnosis not present

## 2023-06-06 DIAGNOSIS — E7800 Pure hypercholesterolemia, unspecified: Secondary | ICD-10-CM | POA: Diagnosis not present

## 2023-06-06 LAB — CBC WITH DIFFERENTIAL/PLATELET
Basophils Absolute: 0 10*3/uL (ref 0.0–0.1)
Basophils Relative: 0.7 % (ref 0.0–3.0)
Eosinophils Absolute: 0.1 10*3/uL (ref 0.0–0.7)
Eosinophils Relative: 2.8 % (ref 0.0–5.0)
HCT: 37.9 % (ref 36.0–46.0)
Hemoglobin: 12.9 g/dL (ref 12.0–15.0)
Lymphocytes Relative: 25.8 % (ref 12.0–46.0)
Lymphs Abs: 1.1 10*3/uL (ref 0.7–4.0)
MCHC: 34.1 g/dL (ref 30.0–36.0)
MCV: 86.6 fL (ref 78.0–100.0)
Monocytes Absolute: 0.3 10*3/uL (ref 0.1–1.0)
Monocytes Relative: 6.7 % (ref 3.0–12.0)
Neutro Abs: 2.7 10*3/uL (ref 1.4–7.7)
Neutrophils Relative %: 64 % (ref 43.0–77.0)
Platelets: 233 10*3/uL (ref 150.0–400.0)
RBC: 4.37 Mil/uL (ref 3.87–5.11)
RDW: 13.4 % (ref 11.5–15.5)
WBC: 4.3 10*3/uL (ref 4.0–10.5)

## 2023-06-06 LAB — BASIC METABOLIC PANEL
BUN: 18 mg/dL (ref 6–23)
CO2: 27 meq/L (ref 19–32)
Calcium: 9.5 mg/dL (ref 8.4–10.5)
Chloride: 104 meq/L (ref 96–112)
Creatinine, Ser: 0.74 mg/dL (ref 0.40–1.20)
GFR: 89.57 mL/min (ref >60.00)
Glucose, Bld: 102 mg/dL — ABNORMAL HIGH (ref 70–99)
Potassium: 3.8 meq/L (ref 3.5–5.1)
Sodium: 140 meq/L (ref 135–145)

## 2023-06-06 LAB — URINALYSIS, ROUTINE W REFLEX MICROSCOPIC
Bilirubin Urine: NEGATIVE
Hgb urine dipstick: NEGATIVE
Ketones, ur: NEGATIVE
Leukocytes,Ua: NEGATIVE
Nitrite: NEGATIVE
RBC / HPF: NONE SEEN (ref 0–?)
Specific Gravity, Urine: 1.015 (ref 1.000–1.030)
Total Protein, Urine: NEGATIVE
Urine Glucose: NEGATIVE
Urobilinogen, UA: 0.2 (ref 0.0–1.0)
pH: 7 (ref 5.0–8.0)

## 2023-06-06 LAB — LIPID PANEL
Cholesterol: 188 mg/dL (ref 0–200)
HDL: 80.9 mg/dL (ref >39.00)
LDL Cholesterol: 87 mg/dL (ref 0–99)
NonHDL: 106.8
Total CHOL/HDL Ratio: 2
Triglycerides: 97 mg/dL (ref 0.0–149.0)
VLDL: 19.4 mg/dL (ref 0.0–40.0)

## 2023-06-06 LAB — HEPATIC FUNCTION PANEL
ALT: 15 U/L (ref 0–35)
AST: 14 U/L (ref 0–37)
Albumin: 4.4 g/dL (ref 3.5–5.2)
Alkaline Phosphatase: 52 U/L (ref 39–117)
Bilirubin, Direct: 0.1 mg/dL (ref 0.0–0.3)
Total Bilirubin: 0.4 mg/dL (ref 0.2–1.2)
Total Protein: 6.9 g/dL (ref 6.0–8.3)

## 2023-06-06 LAB — VITAMIN B12: Vitamin B-12: 266 pg/mL (ref 211–911)

## 2023-06-06 LAB — HEMOGLOBIN A1C: Hgb A1c MFr Bld: 5.7 % (ref 4.6–6.5)

## 2023-06-06 LAB — VITAMIN D 25 HYDROXY (VIT D DEFICIENCY, FRACTURES): VITD: 26.88 ng/mL — ABNORMAL LOW (ref 30.00–100.00)

## 2023-06-06 LAB — TSH: TSH: 1.48 u[IU]/mL (ref 0.35–5.50)

## 2023-06-12 ENCOUNTER — Ambulatory Visit (INDEPENDENT_AMBULATORY_CARE_PROVIDER_SITE_OTHER): Payer: 59 | Admitting: Internal Medicine

## 2023-06-12 ENCOUNTER — Encounter: Payer: Self-pay | Admitting: Internal Medicine

## 2023-06-12 VITALS — BP 118/70 | HR 65 | Temp 98.1°F | Ht 60.0 in | Wt 144.0 lb

## 2023-06-12 DIAGNOSIS — Z0000 Encounter for general adult medical examination without abnormal findings: Secondary | ICD-10-CM | POA: Diagnosis not present

## 2023-06-12 DIAGNOSIS — E538 Deficiency of other specified B group vitamins: Secondary | ICD-10-CM | POA: Diagnosis not present

## 2023-06-12 DIAGNOSIS — E669 Obesity, unspecified: Secondary | ICD-10-CM

## 2023-06-12 DIAGNOSIS — K5730 Diverticulosis of large intestine without perforation or abscess without bleeding: Secondary | ICD-10-CM | POA: Insufficient documentation

## 2023-06-12 DIAGNOSIS — R739 Hyperglycemia, unspecified: Secondary | ICD-10-CM

## 2023-06-12 DIAGNOSIS — K2100 Gastro-esophageal reflux disease with esophagitis, without bleeding: Secondary | ICD-10-CM | POA: Insufficient documentation

## 2023-06-12 DIAGNOSIS — Z0001 Encounter for general adult medical examination with abnormal findings: Secondary | ICD-10-CM

## 2023-06-12 DIAGNOSIS — E559 Vitamin D deficiency, unspecified: Secondary | ICD-10-CM

## 2023-06-12 DIAGNOSIS — F17200 Nicotine dependence, unspecified, uncomplicated: Secondary | ICD-10-CM

## 2023-06-12 DIAGNOSIS — Z1231 Encounter for screening mammogram for malignant neoplasm of breast: Secondary | ICD-10-CM

## 2023-06-12 DIAGNOSIS — K219 Gastro-esophageal reflux disease without esophagitis: Secondary | ICD-10-CM | POA: Insufficient documentation

## 2023-06-12 DIAGNOSIS — E7800 Pure hypercholesterolemia, unspecified: Secondary | ICD-10-CM

## 2023-06-12 MED ORDER — PHENTERMINE HCL 30 MG PO CAPS
30.0000 mg | ORAL_CAPSULE | ORAL | 3 refills | Status: AC
Start: 2023-06-12 — End: ?

## 2023-06-12 NOTE — Progress Notes (Unsigned)
 Patient ID: Cassidy Lee, female   DOB: 03-Jan-1966, 58 y.o.   MRN: [id number]         Chief Complaint:: wellness exam and obesity, low vit d,  smoker, allergies, hld       HPI:  MATHA MASSE is a 58 y.o. female here for wellness exam; plans to see Gyn soon, due for mammogram, declines covid booster and pneumovax, o/w up to date; still smoking, not ready to quit                        Also Pt denies chest pain, increased sob or doe, wheezing, orthopnea, PND, increased LE swelling, palpitations, dizziness or syncope.   Pt denies polydipsia, polyuria, or new focal neuro s/s.   Marland Kitchen Pt denies fever, wt loss, night sweats, loss of appetite, or other constitutional symptoms  Hard to lose wt, unable to afford GLP1.    Wt Readings from Last 3 Encounters:  06/12/23 144 lb (65.3 kg)  06/08/22 148 lb 2 oz (67.2 kg)  07/06/21 146 lb 1.6 oz (66.3 kg)   BP Readings from Last 3 Encounters:  06/12/23 118/70  08/02/22 105/71  06/08/22 110/62   Immunization History  Administered Date(s) Administered   Influenza Inj Mdck Quad Pf 01/15/2019   Influenza,inj,Quad PF,6+ Mos 03/24/2018, 01/15/2019, 01/29/2023   Influenza-Unspecified 03/30/2018, 01/09/2021, 01/19/2022   PFIZER(Purple Top)SARS-COV-2 Vaccination 10/14/2019, 11/04/2019   Td 07/21/2008   Tdap 05/10/2016   Zoster Recombinant(Shingrix) 05/21/2021, 08/04/2021   Health Maintenance Due  Topic Date Due   Pneumococcal Vaccine 24-17 Years old (1 of 2 - PCV) Never done   Cervical Cancer Screening (HPV/Pap Cotest)  07/02/2014      Past Medical History:  Diagnosis Date   ALLERGIC RHINITIS 07/21/2008   Qualifier: Diagnosis of  By: Jonny Ruiz MD, Len Blalock    Allergy    ANXIETY 07/21/2008   Qualifier: Diagnosis of  By: Jonny Ruiz MD, Len Blalock    Blood in stool    Colon polyps    COLONIC POLYPS, HX OF 07/21/2008   Qualifier: Diagnosis of  By: Jonny Ruiz MD, Len Blalock    Fainting spell    IBS 07/21/2008   Qualifier: Diagnosis of  By: Jonny Ruiz MD, Len Blalock    INSOMNIA-SLEEP  DISORDER-UNSPEC 07/01/2009   Qualifier: Diagnosis of  By: Jonny Ruiz MD, Len Blalock    Preventative health care 06/19/2013   UTI (lower urinary tract infection)    Past Surgical History:  Procedure Laterality Date   ABDOMINAL HYSTERECTOMY  2005   foot surgury Left 2005    reports that she has been smoking. She has never used smokeless tobacco. She reports current alcohol use of about 3.0 standard drinks of alcohol per week. She reports that she does not use drugs. family history includes Alcohol abuse in an other family member; Arthritis in her father and mother; Cancer in her mother and other family members; Colon cancer in her maternal uncle; Diabetes in her father, mother, and another family member; Heart disease in her maternal grandfather; Hypertension in her father and another family member; Ovarian cancer in her maternal grandmother. Allergies  Allergen Reactions   Penicillins Other (See Comments)    Has patient had a PCN reaction causing immediate rash, facial/tongue/throat swelling, SOB or lightheadedness with hypotension: Unknown Has patient had a PCN reaction causing severe rash involving mucus membranes or skin necrosis: No Has patient had a PCN reaction that required hospitalization: No Has patient had a PCN reaction  occurring within the last 10 years: No If all of the above answers are "NO", then may proceed with Cephalosporin use. Patient states, "It just doesn't work."   Propoxyphene N-Acetaminophen Hives   Cephalexin Rash    Facial rash   Codeine Hives and Rash   Current Outpatient Medications on File Prior to Visit  Medication Sig Dispense Refill   ALPRAZolam (XANAX) 0.25 MG tablet Take 1 tablet (0.25 mg total) by mouth at bedtime as needed for anxiety. 90 tablet 1   hyoscyamine (LEVSIN/SL) 0.125 MG SL tablet Place 1 tablet (0.125 mg total) under the tongue every 4 (four) hours as needed. (Patient taking differently: Place 0.125 mg under the tongue every 4 (four) hours as needed  for cramping.) 30 tablet 0   atorvastatin (LIPITOR) 10 MG tablet TAKE 1 TABLET BY MOUTH EVERY DAY (Patient not taking: Reported on 06/12/2023) 90 tablet 3   predniSONE (DELTASONE) 10 MG tablet 3 tabs by mouth per day for 3 days,2tabs per day for 3 days,1tab per day for 3 days (Patient not taking: Reported on 06/12/2023) 18 tablet 0   No current facility-administered medications on file prior to visit.        ROS:  All others reviewed and negative.  Objective        PE:  BP 118/70 (BP Location: Right Arm, Patient Position: Sitting, Cuff Size: Normal)   Pulse 65   Temp 98.1 F (36.7 C) (Oral)   Ht 5' (1.524 m)   Wt 144 lb (65.3 kg)   SpO2 98%   BMI 28.12 kg/m                 Constitutional: Pt appears in NAD               HENT: Head: NCAT.                Right Ear: External ear normal.                 Left Ear: External ear normal.                Eyes: . Pupils are equal, round, and reactive to light. Conjunctivae and EOM are normal               Nose: without d/c or deformity               Neck: Neck supple. Gross normal ROM               Cardiovascular: Normal rate and regular rhythm.                 Pulmonary/Chest: Effort normal and breath sounds without rales or wheezing.                Abd:  Soft, NT, ND, + BS, no organomegaly               Neurological: Pt is alert. At baseline orientation, motor grossly intact               Skin: Skin is warm. No rashes, no other new lesions, LE edema - none               Psychiatric: Pt behavior is normal without agitation   Micro: none  Cardiac tracings I have personally interpreted today:  none  Pertinent Radiological findings (summarize): none   Lab Results  Component Value Date   WBC 4.3 06/06/2023   HGB 12.9 06/06/2023  HCT 37.9 06/06/2023   PLT 233.0 06/06/2023   GLUCOSE 102 (H) 06/06/2023   CHOL 188 06/06/2023   TRIG 97.0 06/06/2023   HDL 80.90 06/06/2023   LDLCALC 87 06/06/2023   ALT 15 06/06/2023   AST 14 06/06/2023    NA 140 06/06/2023   K 3.8 06/06/2023   CL 104 06/06/2023   CREATININE 0.74 06/06/2023   BUN 18 06/06/2023   CO2 27 06/06/2023   TSH 1.48 06/06/2023   INR 1.0 06/20/2013   HGBA1C 5.7 06/06/2023   Assessment/Plan:  ODILE VELOSO is a 58 y.o. White or Caucasian [1] female with  has a past medical history of ALLERGIC RHINITIS (07/21/2008), Allergy, ANXIETY (07/21/2008), Blood in stool, Colon polyps, COLONIC POLYPS, HX OF (07/21/2008), Fainting spell, IBS (07/21/2008), INSOMNIA-SLEEP DISORDER-UNSPEC (07/01/2009), Preventative health care (06/19/2013), and UTI (lower urinary tract infection).  Encounter for well adult exam with abnormal findings Age and sex appropriate education and counseling updated with regular exercise and diet Referrals for preventative services - pt to see GYN soon, for mamogram Immunizations addressed - declines covid booster, pneumovax Smoking counseling  - pt counseled to quit, pt not ready Evidence for depression or other mood disorder - none significant Most recent labs reviewed. I have personally reviewed and have noted: 1) the patient's medical and social history 2) The patient's current medications and supplements 3) The patient's height, weight, and BMI have been recorded in the chart   HLD (hyperlipidemia) Lab Results  Component Value Date   LDLCALC 87 06/06/2023   uncontrolled pt for contd lipitor 10 every day and lower chol diet, declines other change   Hyperglycemia Lab Results  Component Value Date   HGBA1C 5.7 06/06/2023   Stable, pt to continue current medical treatment  - diet, wt control   Smoker Pt counsled to quit, pt not ready  Vitamin D deficiency Last vitamin D Lab Results  Component Value Date   VD25OH 26.88 (L) 06/06/2023   Low, to start oral replacement   Obesity (BMI 30-39.9) Ok for phentermine 30 every day,  to f/u any worsening symptoms or concerns  Followup: Return in about 1 year (around 06/11/2024).  Oliver Barre, MD  06/13/2023 9:12 PM  Medical Group [facility]

## 2023-06-12 NOTE — Patient Instructions (Signed)
 Please take all new medication as prescribed - the phentermine for weight loss  Please take OTC Vitamin D3 at 2000 units per day, indefinitely  Please continue all other medications as before, and refills have been done if requested.  Please have the pharmacy call with any other refills you may need.  Please continue your efforts at being more active, low cholesterol diet, and weight control.  You are otherwise up to date with prevention measures today.  Please keep your appointments with your specialists as you may have planned  You will be contacted regarding the referral for: screening mammogram for this month at [facility] if possible  Your lab work was otherwise Very Good !  Please make an Appointment to return for your 1 year visit, or sooner if needed, with Lab testing by Appointment as well, to be done about 3-5 days before at the FIRST FLOOR Lab (so this is for TWO appointments - please see the scheduling desk as you leave)

## 2023-06-13 ENCOUNTER — Encounter: Payer: Self-pay | Admitting: Internal Medicine

## 2023-06-13 DIAGNOSIS — E669 Obesity, unspecified: Secondary | ICD-10-CM | POA: Insufficient documentation

## 2023-06-13 NOTE — Assessment & Plan Note (Signed)
 Pt counsled to quit, pt not ready

## 2023-06-13 NOTE — Assessment & Plan Note (Signed)
 Lab Results  Component Value Date   HGBA1C 5.7 06/06/2023   Stable, pt to continue current medical treatment  - diet, wt control

## 2023-06-13 NOTE — Assessment & Plan Note (Signed)
 Last vitamin D Lab Results  Component Value Date   VD25OH 26.88 (L) 06/06/2023   Low, to start oral replacement

## 2023-06-13 NOTE — Assessment & Plan Note (Signed)
 Lab Results  Component Value Date   LDLCALC 87 06/06/2023   uncontrolled pt for contd lipitor 10 every day and lower chol diet, declines other change

## 2023-06-13 NOTE — Assessment & Plan Note (Signed)
 Age and sex appropriate education and counseling updated with regular exercise and diet Referrals for preventative services - pt to see GYN soon, for mamogram Immunizations addressed - declines covid booster, pneumovax Smoking counseling  - pt counseled to quit, pt not ready Evidence for depression or other mood disorder - none significant Most recent labs reviewed. I have personally reviewed and have noted: 1) the patient's medical and social history 2) The patient's current medications and supplements 3) The patient's height, weight, and BMI have been recorded in the chart

## 2023-06-13 NOTE — Assessment & Plan Note (Signed)
 Ok for phentermine 30 every day,  to f/u any worsening symptoms or concerns

## 2023-06-19 ENCOUNTER — Ambulatory Visit: Admitting: Family Medicine

## 2023-06-19 ENCOUNTER — Ambulatory Visit: Payer: Self-pay | Admitting: Internal Medicine

## 2023-06-19 ENCOUNTER — Encounter: Payer: Self-pay | Admitting: Family Medicine

## 2023-06-19 VITALS — BP 96/58 | HR 74 | Temp 97.6°F | Resp 18 | Ht 60.0 in | Wt 141.0 lb

## 2023-06-19 DIAGNOSIS — R519 Headache, unspecified: Secondary | ICD-10-CM | POA: Diagnosis not present

## 2023-06-19 DIAGNOSIS — R42 Dizziness and giddiness: Secondary | ICD-10-CM

## 2023-06-19 MED ORDER — MECLIZINE HCL 12.5 MG PO TABS: 12.5000 mg | ORAL_TABLET | Freq: Three times a day (TID) | ORAL | 0 refills | Status: AC | PRN

## 2023-06-19 NOTE — Progress Notes (Unsigned)
 Assessment & Plan:  1-2. Dizziness (Primary)/New onset headache Education provided on dizziness. Encouraged meclizine as needed. CT head due to new onset of headaches accompanying new onset dizziness. Offered a heart monitor; patient would like to hold off due to lack of symptoms indicating cardiac involvement. She just recently had labs completed which did not reveal anything that could be contributing to her dizziness.  - CT HEAD WO CONTRAST ( ); Future - meclizine (ANTIVERT) 12.5 MG tablet; Take 1-2 tablets (12.5-25 mg total) by mouth 3 (three) times daily as needed for dizziness.  Dispense: 30 tablet; Refill: 0    Follow up plan: Return if symptoms worsen or fail to improve.  Deliah Boston, MSN, APRN, FNP-C  Subjective:  HPI: Cassidy Lee is a 58 y.o. female presenting on 06/19/2023 for Dizziness (Episode last WED and again SAT: Dizzy, HAs, base of neck pain, nausea.Some mild light-headed in between these episodes)  Patient is accompanied by her husband, who she is okay with being present.  Patient reports two major episodes of dizziness with minor dizziness between these major episodes. The first episode occurred five days ago at which time she was walking into the kitchen when everything started spinning. Symptoms continued to worsen until she felt like she could not move.  Two days ago she was walking through [employer] when she felt the dizziness come on very suddenly. She held onto the cart until symptoms resolved which was just as sudden as they came on. Denies shortness of breath and any heart rhythm irregularities. She did feel anxious, but only because of her symptoms not before they started. She has been having headaches since Wednesday when her first major episode occurred. The headaches are throbbing and start around her eyes and finish around the back of her head, causing her head to feel heavy. They improve with OTC Ibuprofen, but she has not previously had issues with  headaches. She has had a runny nose this past week, but it resolved with OTC antihistamines. She states she drinks water all time.     ROS: Negative unless specifically indicated above in HPI.   Relevant past medical history reviewed and updated as indicated.   Allergies and medications reviewed and updated.   Current Outpatient Medications:    ALPRAZolam (XANAX) 0.25 MG tablet, Take 1 tablet (0.25 mg total) by mouth at bedtime as needed for anxiety., Disp: 90 tablet, Rfl: 1   atorvastatin (LIPITOR) 10 MG tablet, TAKE 1 TABLET BY MOUTH EVERY DAY, Disp: 90 tablet, Rfl: 3   hyoscyamine (LEVSIN/SL) 0.125 MG SL tablet, Place 1 tablet (0.125 mg total) under the tongue every 4 (four) hours as needed. (Patient not taking: Reported on 06/19/2023), Disp: 30 tablet, Rfl: 0   phentermine 30 MG capsule, Take 1 capsule (30 mg total) by mouth every morning. (Patient not taking: Reported on 06/19/2023), Disp: 30 capsule, Rfl: 3  Allergies  Allergen Reactions   Penicillins Other (See Comments)    Has patient had a PCN reaction causing immediate rash, facial/tongue/throat swelling, SOB or lightheadedness with hypotension: Unknown Has patient had a PCN reaction causing severe rash involving mucus membranes or skin necrosis: No Has patient had a PCN reaction that required hospitalization: No Has patient had a PCN reaction occurring within the last 10 years: No If all of the above answers are "NO", then may proceed with Cephalosporin use. Patient states, "It just doesn't work."   Propoxyphene N-Acetaminophen Hives   Cephalexin Rash    Facial rash   Codeine  Hives and Rash    Objective:   BP (!) 96/58   Pulse 74   Temp 97.6 F (36.4 C)   Resp 18   Ht 5' (1.524 m)   Wt 141 lb (64 kg)   SpO2 93%   BMI 27.54 kg/m    Orthostatic VS for the past 72 hrs (Last 3 readings):  Orthostatic BP BP Location Cuff Size Orthostatic Pulse  06/19/23 1413 112/74 -- Normal 82  06/19/23 1412 122/75 Left Arm Normal  74  06/19/23 1410 123/79 Left Arm Normal 78   Physical Exam Vitals reviewed.  Constitutional:      General: She is not in acute distress.    Appearance: Normal appearance. She is not ill-appearing, toxic-appearing or diaphoretic.  HENT:     Head: Normocephalic and atraumatic.     Right Ear: Tympanic membrane, ear canal and external ear normal. There is no impacted cerumen.     Left Ear: Tympanic membrane, ear canal and external ear normal. There is no impacted cerumen.  Eyes:     General: No scleral icterus.       Right eye: No discharge.        Left eye: No discharge.     Conjunctiva/sclera: Conjunctivae normal.  Cardiovascular:     Rate and Rhythm: Normal rate and regular rhythm.     Heart sounds: Normal heart sounds. No murmur heard.    No friction rub. No gallop.  Pulmonary:     Effort: Pulmonary effort is normal. No respiratory distress.     Breath sounds: Normal breath sounds. No stridor. No wheezing, rhonchi or rales.  Musculoskeletal:        General: Normal range of motion.     Cervical back: Normal range of motion.  Skin:    General: Skin is warm and dry.     Capillary Refill: Capillary refill takes less than 2 seconds.  Neurological:     General: No focal deficit present.     Mental Status: She is alert and oriented to person, place, and time. Mental status is at baseline.  Psychiatric:        Mood and Affect: Mood normal.        Behavior: Behavior normal.        Thought Content: Thought content normal.        Judgment: Judgment normal.

## 2023-06-19 NOTE — Telephone Encounter (Signed)
  Chief Complaint: dizziness, HA,  Symptoms: dizziness Frequency: about a week Pertinent Negatives: Patient denies fever, SOB, CP, speech changes, new vision changes, CMS changes Disposition: [] ED /[] Urgent Care (no appt availability in office) / [x] Appointment(In office/virtual)/ []  [place] [facility]/ [] Home Care/ [] Refused Recommended Disposition /[] [place] [facility]/ []  Follow-up with PCP Additional Notes: Pt states that about a week ago she had a bad episode of dizziness. Pt stated that she went to bed and it resolved. Pt states that she has been having the dizziness intermittently for about a week, not as severe as the initial onset. Pt was at work during call, states her dizziness is mild at this time. Pt states she does not have a HA but took OTC medications about 40 min ago. Pt states that she would like a referral for ENT. Pt denies any CMS changes, speech changes, new vision changes. Pt states that she had blurry vision for about a year and got new glasses to resolve the vision issues. Pt sched for today.  Copied from CRM [id number]. Topic: Clinical - Red Word Triage >> Jun 19, 2023 11:19 AM Denese Killings wrote: Red Word that prompted transfer to Nurse Triage: Patient has been having really bad episodes of dizziness with brain fog, headaches, and hip pain. Reason for Disposition  [1] MILD dizziness (e.g., walking normally) AND [2] has NOT been evaluated by doctor (or NP/PA) for this  (Exception: Dizziness caused by heat exposure, sudden standing, or poor fluid intake.)  Answer Assessment - Initial Assessment Questions 1. DESCRIPTION: "Describe your dizziness."     First episode was like I was on a merry-go-round 2. LIGHTHEADED: "Do you feel lightheaded?" (e.g., somewhat faint, woozy, weak upon standing)     Somewhat faint 3. VERTIGO: "Do you feel like either you or the room is spinning or tilting?" (i.e. vertigo)     Everything is spinning, head feels heavy 4. SEVERITY: "How bad  is it?"  "Do you feel like you are going to faint?" "Can you stand and walk?"   - MILD: Feels slightly dizzy, but walking normally.   - MODERATE: Feels unsteady when walking, but not falling; interferes with normal activities (e.g., school, work).   - SEVERE: Unable to walk without falling, or requires assistance to walk without falling; feels like passing out now.      mild 5. ONSET:  "When did the dizziness begin?"     About a week ago 6. AGGRAVATING FACTORS: "Does anything make it worse?" (e.g., standing, change in head position)     denies 7. HEART RATE: "Can you tell me your heart rate?" "How many beats in 15 seconds?"  (Note: not all patients can do this)       denies 8. CAUSE: "What do you think is causing the dizziness?" Maybe ear problems, would like ear referral 9. RECURRENT SYMPTOM: "Have you had dizziness before?" If Yes, ask: "When was the last time?" "What happened that time?"     denies 10. OTHER SYMPTOMS: "Do you have any other symptoms?" (e.g., fever, chest pain, vomiting, diarrhea, bleeding)       denies  Protocols used: Dizziness - Lightheadedness-A-AH

## 2023-06-20 ENCOUNTER — Telehealth: Payer: Self-pay | Admitting: Internal Medicine

## 2023-06-20 NOTE — Telephone Encounter (Signed)
 Copied from CRM [id number]. Topic: Clinical - Request for Lab/Test Order >> Jun 19, 2023  4:43 PM Shelah Lewandowsky wrote: Reason for CRM: Need authorization for CT from insurance- [phone]

## 2023-06-21 ENCOUNTER — Encounter: Payer: Self-pay | Admitting: Family Medicine

## 2023-06-23 ENCOUNTER — Ambulatory Visit
Admission: RE | Admit: 2023-06-23 | Discharge: 2023-06-23 | Disposition: A | Discharge status: Home / Self Care | Source: Ambulatory Visit | Attending: Family Medicine | Admitting: Family Medicine

## 2023-06-23 DIAGNOSIS — R519 Headache, unspecified: Secondary | ICD-10-CM

## 2023-06-23 DIAGNOSIS — R42 Dizziness and giddiness: Secondary | ICD-10-CM

## 2023-06-28 ENCOUNTER — Ambulatory Visit
Admission: RE | Admit: 2023-06-28 | Discharge: 2023-06-28 | Disposition: A | Discharge status: Home / Self Care | Source: Ambulatory Visit | Attending: Internal Medicine | Admitting: Internal Medicine

## 2023-06-28 DIAGNOSIS — Z1231 Encounter for screening mammogram for malignant neoplasm of breast: Secondary | ICD-10-CM

## 2023-07-03 ENCOUNTER — Encounter: Payer: Self-pay | Admitting: Family Medicine

## 2023-07-06 NOTE — Addendum Note (Signed)
 Addended by: Magdalene River on: 07/06/2023 09:07 AM   Modules accepted: Orders

## 2023-09-04 ENCOUNTER — Telehealth: Admitting: Physician Assistant

## 2023-09-04 DIAGNOSIS — J069 Acute upper respiratory infection, unspecified: Secondary | ICD-10-CM

## 2023-09-04 MED ORDER — BENZONATATE 100 MG PO CAPS
100.0000 mg | ORAL_CAPSULE | Freq: Three times a day (TID) | ORAL | 0 refills | Status: AC | PRN
Start: 2023-09-04 — End: ?

## 2023-09-04 NOTE — Progress Notes (Signed)
 I have spent 5 minutes in review of e-visit questionnaire, review and updating patient chart, medical decision making and response to patient.   Piedad Climes, PA-C

## 2023-09-04 NOTE — Progress Notes (Signed)

## 2023-09-06 ENCOUNTER — Emergency Department

## 2023-09-06 ENCOUNTER — Encounter: Payer: Self-pay | Admitting: Intensive Care

## 2023-09-06 ENCOUNTER — Emergency Department
Admission: EM | Admit: 2023-09-06 | Discharge: 2023-09-06 | Disposition: A | Discharge status: Home / Self Care | Attending: Emergency Medicine | Admitting: Emergency Medicine

## 2023-09-06 ENCOUNTER — Other Ambulatory Visit: Payer: Self-pay

## 2023-09-06 DIAGNOSIS — R0602 Shortness of breath: Secondary | ICD-10-CM | POA: Diagnosis present

## 2023-09-06 DIAGNOSIS — J9801 Acute bronchospasm: Secondary | ICD-10-CM | POA: Diagnosis not present

## 2023-09-06 MED ORDER — HYDROCOD POLI-CHLORPHE POLI ER 10-8 MG/5ML PO SUER: 5.0000 mL | Freq: Two times a day (BID) | ORAL | 0 refills | Status: AC | PRN

## 2023-09-06 MED ORDER — IPRATROPIUM-ALBUTEROL 0.5-2.5 (3) MG/3ML IN SOLN
3.0000 mL | Freq: Once | RESPIRATORY_TRACT | Status: AC
  Administered 2023-09-06: 3 mL via RESPIRATORY_TRACT
  Filled 2023-09-06: qty 3

## 2023-09-06 MED ORDER — PREDNISONE 50 MG PO TABS
50.0000 mg | ORAL_TABLET | Freq: Every day | ORAL | 0 refills | Status: DC
Start: 2023-09-07 — End: 2023-09-08

## 2023-09-06 MED ORDER — PREDNISONE 20 MG PO TABS
60.0000 mg | ORAL_TABLET | Freq: Once | ORAL | Status: AC
  Administered 2023-09-06: 60 mg via ORAL
  Filled 2023-09-06: qty 3

## 2023-09-06 MED ORDER — ALBUTEROL SULFATE HFA 108 (90 BASE) MCG/ACT IN AERS: 2.0000 | INHALATION_SPRAY | Freq: Four times a day (QID) | RESPIRATORY_TRACT | 2 refills | Status: AC | PRN

## 2023-09-06 NOTE — ED Notes (Signed)
 Pt complains of shortness of breath since Sunday. States that she is unable to take a full deep breat. Pt was cleaning the house with very strong cleaning products, and her breathing hasn't been the same since. Pt complains of chest soreness from constantly coughing. Pt has been taking tylenol  and a prescribed pill from primary care for cough with minimal relief. Pt congested on presentation.

## 2023-09-06 NOTE — ED Notes (Signed)
 Pt ambulatory to bathroom with no assistance

## 2023-09-06 NOTE — ED Triage Notes (Signed)
 Patient reports she had cold symptoms Saturday and then started cleaning the house with "cleaning vinegar." Reports when she woke up Sunday she started having some SOB and it has gotten worse.   Reports pain when she coughs  Reports chills, cough, and congestion

## 2023-09-06 NOTE — ED Provider Notes (Signed)
 [facility] Provider Note    Event Date/Time   First MD Initiated Contact with Patient 09/06/23 [phone]     (approximate)   History   Shortness of Breath   HPI  Cassidy Lee is a 58 y.o. female with a history of prior cigarette smoking, now uses vapes who presents with complaints of shortness of breath, chest tightness, she thinks this may be related to using chemicals to clean the house.     Physical Exam   Triage Vital Signs: ED Triage Vitals  Encounter Vitals Group     BP 09/06/23 0952 123/66     Systolic BP Percentile --      Diastolic BP Percentile --      Pulse Rate 09/06/23 0952 82     Resp 09/06/23 0952 20     Temp 09/06/23 0952 98.4 F (36.9 C)     Temp Source 09/06/23 0952 Oral     SpO2 09/06/23 0952 95 %     Weight 09/06/23 0949 63.5 kg (140 lb)     Height 09/06/23 0949 1.524 m (5')     Head Circumference --      Peak Flow --      Pain Score 09/06/23 0948 8     Pain Loc --      Pain Education --      Exclude from Growth Chart --     Most recent vital signs: Vitals:   09/06/23 0952  BP: 123/66  Pulse: 82  Resp: 20  Temp: 98.4 F (36.9 C)  SpO2: 95%     General: Awake, no distress.  CV:  Good peripheral perfusion.  Resp:  Normal effort.  Scattered wheezing Abd:  No distention.  Other:     ED Results / Procedures / Treatments   Labs (all labs ordered are listed, but only abnormal results are displayed) Labs Reviewed - No data to display   EKG  ED ECG REPORT I, Bryson Carbine, the attending physician, personally viewed and interpreted this ECG.  Date: 09/06/2023  Rhythm: normal sinus rhythm QRS Axis: normal Intervals: normal ST/T Wave abnormalities: normal Narrative Interpretation: no evidence of acute ischemia    RADIOLOGY Chest x-ray viewed interpret by me, no acute abnormality    PROCEDURES:  Critical Care performed:   Procedures   MEDICATIONS ORDERED IN ED: Medications   ipratropium-albuterol (DUONEB) 0.5-2.5 (3) MG/3ML nebulizer solution 3 mL (3 mLs Nebulization Given 09/06/23 1029)  ipratropium-albuterol (DUONEB) 0.5-2.5 (3) MG/3ML nebulizer solution 3 mL (3 mLs Nebulization Given 09/06/23 1028)  ipratropium-albuterol (DUONEB) 0.5-2.5 (3) MG/3ML nebulizer solution 3 mL (3 mLs Nebulization Given 09/06/23 1105)  predniSONE  (DELTASONE ) tablet 60 mg (60 mg Oral Given 09/06/23 1105)     IMPRESSION / MDM / ASSESSMENT AND PLAN / ED COURSE  I reviewed the triage vital signs and the nursing notes. Patient's presentation is most consistent with acute presentation with potential threat to life or bodily function.  Patient presents with reports of shortness of breath as detailed above, suspicious for bronchospasm, COPD, pneumonia, less likely pneumothorax  Scattered wheezing on exam, will treat with DuoNebs and reevaluate  Chest x-ray is reassuring  Patient is feeling better after DuoNebs and p.o. prednisone , vital signs are stable.  Oxygen saturation 100%.  She is appropriate for discharge at this time, no indication for admission.  Will discharge with Tussidex, p.o. prednisone , albuterol inhaler, strict return precautions, she agrees with this plan.      FINAL CLINICAL IMPRESSION(S) /  ED DIAGNOSES   Final diagnoses:  Bronchospasm     Rx / DC Orders   ED Discharge Orders          Ordered    predniSONE  (DELTASONE ) 50 MG tablet  Daily with breakfast        09/06/23 1152    albuterol (VENTOLIN HFA) 108 (90 Base) MCG/ACT inhaler  Every 6 hours PRN        09/06/23 1152    chlorpheniramine-HYDROcodone  (TUSSIONEX) 10-8 MG/5ML  Every 12 hours PRN        09/06/23 1152             Note:  This document was prepared using Dragon voice recognition software and may include unintentional dictation errors.   Bryson Carbine, MD 09/06/23 [id number]

## 2023-09-06 NOTE — ED Notes (Signed)
 Patient transported to X-ray

## 2023-09-08 ENCOUNTER — Ambulatory Visit: Admitting: Family

## 2023-09-08 ENCOUNTER — Ambulatory Visit: Payer: Self-pay

## 2023-09-08 ENCOUNTER — Telehealth: Payer: Self-pay

## 2023-09-08 VITALS — BP 138/82 | HR 65 | Temp 98.1°F | Ht 60.0 in | Wt 141.0 lb

## 2023-09-08 DIAGNOSIS — R051 Acute cough: Secondary | ICD-10-CM

## 2023-09-08 DIAGNOSIS — J209 Acute bronchitis, unspecified: Secondary | ICD-10-CM

## 2023-09-08 MED ORDER — DOXYCYCLINE HYCLATE 100 MG PO TABS
100.0000 mg | ORAL_TABLET | Freq: Two times a day (BID) | ORAL | 0 refills | Status: AC
Start: 2023-09-08 — End: ?

## 2023-09-08 MED ORDER — PREDNISONE 50 MG PO TABS: ORAL_TABLET | ORAL | 0 refills | Status: AC

## 2023-09-08 MED ORDER — PROMETHAZINE-DM 6.25-15 MG/5ML PO SYRP: 5.0000 mL | ORAL_SOLUTION | Freq: Four times a day (QID) | ORAL | 0 refills | Status: AC | PRN

## 2023-09-08 NOTE — Progress Notes (Signed)
 Acute Office Visit  Subjective:     Patient ID: Cassidy Lee, female    DOB: 02-Mar-1966, 58 y.o.   MRN: [id number]  Chief Complaint  Patient presents with  . Cough    Patient states she believes it came from the cleaning vinegar she used over the weekend and seems to get worse since. Hard to sleep, she can not lay flat, sob only when she coughs.  She did go to ED on 5/28 in [place], she was given an inhaler, prednisone , tessalon, and tussionex she states it feels like an infection. She is coughing up green/ brown mucus. She did do a covid test at home and it was negative     HPI Patient is in today after being seen in the emergency department on Sep 06, 2023 with bronchitis.  She was prescribed prednisone , and inhaler and Tessalon Perles.  She feels like she is some better today.  She completes the prednisone  today.  Overall continues to have congestion, green mucus and just concerned about a bacterial infection.  She is not resting well at night due to the cough.  No known sick contact.    Review of Systems  HENT:  Positive for congestion and sore throat.   Respiratory:  Positive for cough, sputum production and wheezing.   Cardiovascular: Negative.   Musculoskeletal: Negative.   Neurological: Negative.   Endo/Heme/Allergies: Negative.   Psychiatric/Behavioral: Negative.    All other systems reviewed and are negative.  Past Medical History:  Diagnosis Date  . ALLERGIC RHINITIS 07/21/2008   Qualifier: Diagnosis of  By: Autry Legions MD, Alveda Aures   . Allergy   . ANXIETY 07/21/2008   Qualifier: Diagnosis of  By: Autry Legions MD, Alveda Aures   . Blood in stool   . Colon polyps   . COLONIC POLYPS, HX OF 07/21/2008   Qualifier: Diagnosis of  By: Autry Legions MD, Alveda Aures   . Fainting spell   . 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 Preventative health care 06/19/2013  . UTI (lower urinary  tract infection)     Social History   Socioeconomic History  . Marital status: Married    Spouse name: Not on file  . Number of children: 2  . Years of education: 34  . Highest education level: Not on file  Occupational History  . Occupation: Conservator, museum/gallery.  Tobacco Use  . Smoking status: Former    Types: Cigarettes  . Smokeless tobacco: Never  Vaping Use  . Vaping status: Every Day  . Substances: Nicotine  Substance and Sexual Activity  . Alcohol use: Yes    Alcohol/week: 5.0 standard drinks of alcohol    Types: 5 Cans of beer per week  . Drug use: No  . Sexual activity: Not on file  Other Topics Concern  . Not on file  Social History Narrative  . Not on file   Social Drivers of Health   Financial Resource Strain: Low Risk  (09/08/2023)   Overall Financial Resource Strain (CARDIA)   . Difficulty of Paying Living Expenses: Not very hard  Food Insecurity: No Food Insecurity (09/08/2023)   Hunger Vital Sign   . Worried About Programme researcher, broadcasting/film/video in the Last Year: Never true   . Ran Out of Food in the Last Year: Never true  Transportation Needs: No Transportation Needs (09/08/2023)  PRAPARE - Transportation   . Lack of Transportation (Medical): No   . Lack of Transportation (Non-Medical): No  Physical Activity: Insufficiently Active (09/08/2023)   Exercise Vital Sign   . Days of Exercise per Week: 5 days   . Minutes of Exercise per Session: 10 min  Stress: Stress Concern Present (09/08/2023)   [employer] of Occupational Health - Occupational Stress Questionnaire   . Feeling of Stress : Very much  Social Connections: Moderately Isolated (09/08/2023)   Social Connection and Isolation Panel [NHANES]   . Frequency of Communication with Friends and Family: Three times a week   . Frequency of Social Gatherings with Friends and Family: Once a week   . Attends Religious Services: Never   . Active Member of Clubs or Organizations: No   . Attends Banker  Meetings: Not on file   . Marital Status: Married  Catering manager Violence: Not on file    Past Surgical History:  Procedure Laterality Date  . ABDOMINAL HYSTERECTOMY  2005  . foot surgury Left 2005    Family History  Problem Relation Age of Onset  . Arthritis Mother   . Cancer Mother        colon and ovarian  . Diabetes Mother   . Arthritis Father   . Hypertension Father   . Diabetes Father   . Colon cancer Maternal Uncle   . Ovarian cancer Maternal Grandmother   . Heart disease Maternal Grandfather   . Cancer Other   . Hypertension Other   . Diabetes Other   . Alcohol abuse Other   . Cancer Other        colon cancer  . Breast cancer Neg Hx     Allergies  Allergen Reactions  . Penicillins Other (See Comments)    Has patient had a PCN reaction causing immediate rash, facial/tongue/throat swelling, SOB or lightheadedness with hypotension: Unknown Has patient had a PCN reaction causing severe rash involving mucus membranes or skin necrosis: No Has patient had a PCN reaction that required hospitalization: No Has patient had a PCN reaction occurring within the last 10 years: No If all of the above answers are "NO", then may proceed with Cephalosporin use. Patient states, "It just doesn't work."  . Propoxyphene N-Acetaminophen  Hives  . Cephalexin  Rash    Facial rash  . Codeine Hives and Rash    Current Outpatient Medications on File Prior to Visit  Medication Sig Dispense Refill  . albuterol (VENTOLIN HFA) 108 (90 Base) MCG/ACT inhaler Inhale 2 puffs into the lungs every 6 (six) hours as needed for wheezing or shortness of breath. 8.5 g 2  . ALPRAZolam  (XANAX ) 0.25 MG tablet Take 1 tablet (0.25 mg total) by mouth at bedtime as needed for anxiety. 90 tablet 1  . atorvastatin  (LIPITOR) 10 MG tablet TAKE 1 TABLET BY MOUTH EVERY DAY 90 tablet 3  . benzonatate (TESSALON) 100 MG capsule Take 1 capsule (100 mg total) by mouth 3 (three) times daily as needed for cough. 30  capsule 0  . chlorpheniramine-HYDROcodone  (TUSSIONEX) 10-8 MG/5ML Take 5 mLs by mouth every 12 (twelve) hours as needed for up to 7 days for cough. 70 mL 0  . hyoscyamine  (LEVSIN/SL) 0.125 MG SL tablet Place 1 tablet (0.125 mg total) under the tongue every 4 (four) hours as needed. 30 tablet 0  . meclizine  (ANTIVERT ) 12.5 MG tablet Take 1-2 tablets (12.5-25 mg total) by mouth 3 (three) times daily as needed for dizziness. 30 tablet 0  .  phentermine  30 MG capsule Take 1 capsule (30 mg total) by mouth every morning. 30 capsule 3   No current facility-administered medications on file prior to visit.    BP 138/82 (BP Location: Left Arm, Patient Position: Sitting, Cuff Size: Normal)   Pulse 65   Temp 98.1 F (36.7 C) (Oral)   Ht 5' (1.524 m)   Wt 141 lb (64 kg)   SpO2 95%   BMI 27.54 kg/m chart     Objective:    BP 138/82 (BP Location: Left Arm, Patient Position: Sitting, Cuff Size: Normal)   Pulse 65   Temp 98.1 F (36.7 C) (Oral)   Ht 5' (1.524 m)   Wt 141 lb (64 kg)   SpO2 95%   BMI 27.54 kg/m    Physical Exam Vitals and nursing note reviewed.  Constitutional:      General: She is in acute distress.     Appearance: She is obese.  HENT:     Right Ear: Tympanic membrane, ear canal and external ear normal.     Left Ear: Tympanic membrane, ear canal and external ear normal.  Eyes:     Extraocular Movements: Extraocular movements intact.     Conjunctiva/sclera: Conjunctivae normal.  Cardiovascular:     Rate and Rhythm: Normal rate and regular rhythm.  Pulmonary:     Effort: Pulmonary effort is normal.     Breath sounds: Wheezing present.  Musculoskeletal:        General: Normal range of motion.     Cervical back: Normal range of motion and neck supple.  Skin:    General: Skin is warm and dry.  Neurological:     General: No focal deficit present.     Mental Status: She is alert and oriented to person, place, and time. Mental status is at baseline.  Psychiatric:         Mood and Affect: Mood normal.        Behavior: Behavior normal.   No results found for any visits on 09/08/23.      Assessment & Plan:   Problem List Items Addressed This Visit   None Visit Diagnoses       Acute bronchitis, unspecified organism    -  Primary     Acute cough           Meds ordered this encounter  Medications  . predniSONE  (DELTASONE ) 50 MG tablet    Sig: 1 po daily x 5 days    Dispense:  5 tablet    Refill:  0  . promethazine -dextromethorphan (PROMETHAZINE -DM) 6.25-15 MG/5ML syrup    Sig: Take 5 mLs by mouth 4 (four) times daily as needed.    Dispense:  118 mL    Refill:  0  . doxycycline  (VIBRA -TABS) 100 MG tablet    Sig: Take 1 tablet (100 mg total) by mouth 2 (two) times daily.    Dispense:  20 tablet    Refill:  0   Call the office if symptoms worsen or persist.  Recheck as scheduled.  And sooner as needed. No follow-ups on file.  Fran Mcree B Wilbert Hayashi, FNP

## 2023-09-08 NOTE — Telephone Encounter (Signed)
 Chief Complaint: Shortness of Breath Symptoms: wheezing, productive cough, green/brown sputum Frequency: x 4 days Pertinent Negatives: Patient denies fever Disposition: [] ED /[] Urgent Care (no appt availability in office) / [] Appointment(In office/virtual)/ []  [facility]/ [] Home Care/ [] Refused Recommended Disposition /[] [facility]/ []  Follow-up with PCP Additional Notes: Pt was placed on steroids in ED Wednesday, using OTC Mucinex DM, negative home Covid test. Reports she was advised to follow up with PCP. Pt reports she feels worse than when she was seen in ED, notes sputum changing from green to brown, worsening chest congestion. Denies fever. OV scheduled. This RN educated pt on home care, new-worsening symptoms, when to call back/seek emergent care. Pt verbalized understanding and agrees to plan.    Copied from CRM [id number]. Topic: Clinical - Red Word Triage >> Sep 08, 2023 10:09 AM Baldo Levan wrote: Red Word that prompted transfer to Nurse Triage: Patient was in the hospital for trouble breathing on Wednesday. Patient is still having a lot of issues with breathing. Patient is worried about going over the weekend with this issue still occurring. Reason for Disposition  [1] MILD difficulty breathing (e.g., minimal/no SOB at rest, SOB with walking, pulse <100) AND [2] NEW-onset or WORSE than normal  Answer Assessment - Initial Assessment Questions 1. RESPIRATORY STATUS: "Describe your breathing?" (e.g., wheezing, shortness of breath, unable to speak, severe coughing)      Shortness of Breath 2. ONSET: "When did this breathing problem begin?"      Was seen in ED Wednesday 3. PATTERN "Does the difficult breathing come and go, or has it been constant since it started?"      Intermittent 4. SEVERITY: "How bad is your breathing?" (e.g., mild, moderate, severe)    - MILD: No SOB at rest, mild SOB with walking, speaks normally in sentences, can lie down, no retractions,  pulse < 100.    - MODERATE: SOB at rest, SOB with minimal exertion and prefers to sit, cannot lie down flat, speaks in phrases, mild retractions, audible wheezing, pulse 100-120.    - SEVERE: Very SOB at rest, speaks in single words, struggling to breathe, sitting hunched forward, retractions, pulse > 120      More SOB at rest than when exerting, feels like "stuff settling in chest" 8. CAUSE: "What do you think is causing the breathing problem?"      Unknown 9. OTHER SYMPTOMS: "Do you have any other symptoms? (e.g., dizziness, runny nose, cough, chest pain, fever)     Productive cough with dark green/almost brown sputum, wheezing, chest congestion,  Protocols used: Breathing Difficulty-A-AH

## 2023-09-08 NOTE — Transitions of Care (Post Inpatient/ED Visit) (Signed)
   09/08/2023  Name: Cassidy Lee MRN: [id number] DOB: 1966/02/20  Today's TOC FU Call Status: Today's TOC FU Call Status:: Unsuccessful Call (1st Attempt)  Attempted to reach the patient regarding the most recent Inpatient/ED visit.  Follow Up Plan: Additional outreach attempts will be made to reach the patient to complete the Transitions of Care (Post Inpatient/ED visit) call.   Signature Celeste Candelas,CMA

## 2023-10-30 ENCOUNTER — Encounter: Payer: Self-pay | Admitting: Neurology

## 2023-10-30 ENCOUNTER — Ambulatory Visit: Admitting: Neurology

## 2023-10-30 VITALS — Ht 60.0 in | Wt 143.0 lb

## 2023-10-30 DIAGNOSIS — R0683 Snoring: Secondary | ICD-10-CM | POA: Diagnosis not present

## 2023-10-30 DIAGNOSIS — H8190 Unspecified disorder of vestibular function, unspecified ear: Secondary | ICD-10-CM | POA: Diagnosis not present

## 2023-10-30 DIAGNOSIS — G43909 Migraine, unspecified, not intractable, without status migrainosus: Secondary | ICD-10-CM

## 2023-10-30 DIAGNOSIS — R519 Headache, unspecified: Secondary | ICD-10-CM

## 2023-10-30 DIAGNOSIS — Z9189 Other specified personal risk factors, not elsewhere classified: Secondary | ICD-10-CM

## 2023-10-30 DIAGNOSIS — E663 Overweight: Secondary | ICD-10-CM

## 2023-10-30 MED ORDER — RIZATRIPTAN BENZOATE 5 MG PO TABS: 5.0000 mg | ORAL_TABLET | ORAL | 0 refills | Status: DC | PRN

## 2023-10-30 NOTE — Patient Instructions (Addendum)
 It was nice to meet you today.   As discussed, your headaches are likely due to a combination of factors.   Here is what we discussed today and my recommendations for you:   Please remember, common headache triggers are: sleep deprivation, dehydration, overheating, stress, hypoglycemia or skipping meals and blood sugar fluctuations, excessive pain medications or excessive alcohol use or caffeine withdrawal. Some people have food triggers such as aged cheese, orange juice or chocolate, especially dark chocolate, or MSG (monosodium glutamate). Try to avoid these headache triggers as much possible. It may be helpful to keep a headache diary to figure out what makes your headaches worse or brings them on and what alleviates them. Some people report headache onset after exercise but studies have shown that regular exercise may actually prevent headaches from coming. If you have exercise-induced headaches, please make sure that you drink plenty of fluid before and after exercising and that you do not over do it and do not overheat. Please reduce and eliminate daily alcohol consumption as alcohol can cause sleep disturbance and may interfere with medications for headaches and dizziness.  Limit your caffeine to 1 or 2 servings per day, total of 16 ounces.  We will do a home sleep test to look for signs of obstructive sleep apnea (aka OSA). The long-term risks and ramifications of untreated moderate to severe obstructive sleep apnea may include (but are not limited to): increased risk for cardiovascular disease, including congestive heart failure, stroke, difficult to control hypertension, treatment resistant obesity, arrhythmias, especially irregular heartbeat commonly known as A. Fib. (atrial fibrillation); even type 2 diabetes has been linked to untreated OSA. If yo obstructive sleep apnea I would likely offer you treatment with an AutoPap machine. For headache management for as needed use, I will start you on  Maxalt  5 mg tab: take 1 pill early on when you suspect a migraine attack come on. You may take another pill after 2 hours, no more than 2 pills in 24 hours. Most people who take triptans do not have any serious side-effects. However, they can cause drowsiness (remember to not drive or use heavy machinery when drowsy), nausea, dizziness, dry mouth. Less common side effects include strange sensations, such as tightness in your chest or throat, tingling, flushing, and feelings of heaviness or pressure in areas such as the face, limbs, and chest. These in the chest can mimic heart related pain (angina) and may cause alarm, but usually these sensations are not harmful or a sign of a heart attack. However, if you develop intense chest pain or sensations of discomfort, you should stop taking your medication and consult with me or your PCP or go to the nearest urgent care facility or ER or call 911.  Talk to your primary care about seeing an ENT specialist next.   We will plan a follow up after your home sleep test.

## 2023-10-30 NOTE — Progress Notes (Signed)
 Subjective:    Patient ID: Cassidy Lee is a 58 y.o. female.  HPI    True Mar, MD, PhD [facility] [address], [address] [address] [place], [place] [address]  Dear Niki,  I saw your patient, Cassidy Lee, upon your kind request in my neurologic clinic today for evaluation of her recurrent headaches and dizziness.  The patient is unaccompanied today.  As you know, Cassidy Lee is a 58 year old female with an underlying medical history of allergic rhinitis, irritable bowel syndrome, and mildly overweight state, who reports intermittent dizziness and headaches.  Symptoms have been ongoing for at least a year and intermittent dizziness is her major problem, she had a significant bout of vertigo about a year ago, she feels that her eyes become crossed and she has trouble focusing.  She does have medication for nausea intermittently, does not have any medications for migraines, has taken ibuprofen with fairly good success.  she does not feel that the meclizine  has helped much.  She tries to hydrate well with water.  She has not seen ENT but it has been discussed with her PCP.  She reports that her dizzy spells are like she is on a merry-go-round.  Headaches are associated with light sensitivity.  Headache frequency is about once every few weeks or sometimes several times a week.  She has not noticed any major triggers.  She goes to bed between 8 and 930, rise time is around 4.  She works in a Chemical engineer.  She limits her caffeine to 18 ounce tea in the morning.  She does drink alcohol daily in the form of beer, about 2/day.  She snores occasionally per husband.  She has not had a sleep study.  Her Epworth sleepiness score is 4 out of 24.  She has no nightly nocturia but has woken up with a headache before.  Her son has sleep apnea. She is up-to-date with her eye examination.  She reports that her eye doctor told her her eyes were fine.  She did need new eyeglasses  and got them less than a year ago.   I reviewed your office note from 06/19/2023.  He had a head CT without contrast on 06/23/2023 and I have reviewed the results:  IMPRESSION: Normal head CT without contrast for age     In addition, I personally and independently reviewed images through the PACS system.   She has been taking meclizine  as needed.  Her Past Medical History Is Significant For: Past Medical History:  Diagnosis Date   ALLERGIC RHINITIS 07/21/2008   Qualifier: Diagnosis of  By: Norleen MD, Lynwood ORN    Allergy    ANXIETY 07/21/2008   Qualifier: Diagnosis of  By: Norleen MD, Lynwood ORN    Blood in stool    Colon polyps    COLONIC POLYPS, HX OF 07/21/2008   Qualifier: Diagnosis of  By: Norleen MD, Lynwood ORN    Fainting spell    IBS 07/21/2008   Qualifier: Diagnosis of  By: Norleen MD, Lynwood ORN    INSOMNIA-SLEEP DISORDER-UNSPEC 07/01/2009   Qualifier: Diagnosis of  By: Norleen MD, Lynwood ORN    Preventative health care 06/19/2013   UTI (lower urinary tract infection)     Her Past Surgical History Is Significant For: Past Surgical History:  Procedure Laterality Date   ABDOMINAL HYSTERECTOMY  2005   foot surgury Left 2005    Her Family History Is Significant For: Family History  Problem Relation  Age of Onset   Arthritis Mother    Cancer Mother        colon and ovarian   Diabetes Mother    Arthritis Father    Hypertension Father    Diabetes Father    Colon cancer Maternal Uncle    Ovarian cancer Maternal Grandmother    Heart disease Maternal Grandfather    Cancer Other    Hypertension Other    Diabetes Other    Alcohol abuse Other    Cancer Other        colon cancer   Breast cancer Neg Hx    Headache Neg Hx    Migraines Neg Hx     Her Social History Is Significant For: Social History   Socioeconomic History   Marital status: Married    Spouse name: Not on file   Number of children: 2   Years of education: 11   Highest education level: Not on file  Occupational History    Occupation: Self-employed.  Tobacco Use   Smoking status: Former    Types: Cigarettes   Smokeless tobacco: Never  Vaping Use   Vaping status: Every Day   Substances: Nicotine  Substance and Sexual Activity   Alcohol use: Yes    Alcohol/week: 14.0 standard drinks of alcohol    Types: 14 Cans of beer per week   Drug use: No   Sexual activity: Not on file  Other Topics Concern   Not on file  Social History Narrative   Working    Lives with husband    Social Drivers of Corporate investment banker Strain: Low Risk  (09/08/2023)   Overall Financial Resource Strain (CARDIA)    Difficulty of Paying Living Expenses: Not very hard  Food Insecurity: No Food Insecurity (09/08/2023)   Hunger Vital Sign    Worried About Running Out of Food in the Last Year: Never true    Ran Out of Food in the Last Year: Never true  Transportation Needs: No Transportation Needs (09/08/2023)   PRAPARE - Administrator, Civil Service (Medical): No    Lack of Transportation (Non-Medical): No  Physical Activity: Insufficiently Active (09/08/2023)   Exercise Vital Sign    Days of Exercise per Week: 5 days    Minutes of Exercise per Session: 10 min  Stress: Stress Concern Present (09/08/2023)   [employer] of Occupational Health - Occupational Stress Questionnaire    Feeling of Stress : Very much  Social Connections: Moderately Isolated (09/08/2023)   Social Connection and Isolation Panel    Frequency of Communication with Friends and Family: Three times a week    Frequency of Social Gatherings with Friends and Family: Once a week    Attends Religious Services: Never    Database administrator or Organizations: No    Attends Engineer, structural: Not on file    Marital Status: Married    Her Allergies Are:  Allergies  Allergen Reactions   Penicillins Other (See Comments)    Has patient had a PCN reaction causing immediate rash, facial/tongue/throat swelling, SOB or  lightheadedness with hypotension: Unknown Has patient had a PCN reaction causing severe rash involving mucus membranes or skin necrosis: No Has patient had a PCN reaction that required hospitalization: No Has patient had a PCN reaction occurring within the last 10 years: No If all of the above answers are NO, then may proceed with Cephalosporin use. Patient states, It just doesn't work.   Propoxyphene  N-Acetaminophen  Hives   Cephalexin  Rash    Facial rash   Codeine Hives and Rash  :   Her Current Medications Are:  Outpatient Encounter Medications as of 10/30/2023  Medication Sig   ALPRAZolam  (XANAX ) 0.25 MG tablet Take 1 tablet (0.25 mg total) by mouth at bedtime as needed for anxiety.   atorvastatin  (LIPITOR) 10 MG tablet TAKE 1 TABLET BY MOUTH EVERY DAY   hyoscyamine  (LEVSIN /SL) 0.125 MG SL tablet Place 1 tablet (0.125 mg total) under the tongue every 4 (four) hours as needed. (Patient taking differently: Place 0.125 mg under the tongue as needed.)   meclizine  (ANTIVERT ) 12.5 MG tablet Take 1-2 tablets (12.5-25 mg total) by mouth 3 (three) times daily as needed for dizziness. (Patient taking differently: Take 12.5-25 mg by mouth as needed for dizziness.)   albuterol  (VENTOLIN  HFA) 108 (90 Base) MCG/ACT inhaler Inhale 2 puffs into the lungs every 6 (six) hours as needed for wheezing or shortness of breath.   benzonatate  (TESSALON ) 100 MG capsule Take 1 capsule (100 mg total) by mouth 3 (three) times daily as needed for cough.   doxycycline  (VIBRA -TABS) 100 MG tablet Take 1 tablet (100 mg total) by mouth 2 (two) times daily.   phentermine  30 MG capsule Take 1 capsule (30 mg total) by mouth every morning.   predniSONE  (DELTASONE ) 50 MG tablet 1 po daily x 5 days   promethazine -dextromethorphan (PROMETHAZINE -DM) 6.25-15 MG/5ML syrup Take 5 mLs by mouth 4 (four) times daily as needed.   No facility-administered encounter medications on file as of 10/30/2023.  :   Review of Systems:  Out  of a complete 14 point review of systems, all are reviewed and negative with the exception of these symptoms as listed below:   Review of Systems  Neurological:        Pt here for dizziness and headaches Pt states intermediate dizziness and 15 headaches in last month Pt states some vision disturbance with headaches   ESS:4     Objective:  Neurological Exam  Physical Exam Physical Examination:  See orthostatic vitals, she had a mild drop in systolic blood pressure of 14 points upon standing.  General Examination: The patient is a very pleasant 58 y.o. female in no acute distress. She appears well-developed and well-nourished and well groomed.   HEENT: Normocephalic, atraumatic, pupils are equal, round and reactive to light, extraocular tracking is good without limitation to gaze excursion or nystagmus noted. Hearing is grossly intact to tuning fork, tympanic membranes clear bilaterally. Face is symmetric with normal facial animation and normal facial sensation to light touch, temperature and vibration sense. Speech is clear with no dysarthria noted. There is no hypophonia. There is no lip, neck/head, jaw or voice tremor. Neck is supple with full range of passive and active motion. There are no carotid bruits on auscultation. Oropharynx exam reveals: moderate mouth dryness, good dental hygiene and mild airway crowding, due to small airway entry, Mallampati class II, tonsils small (?absent).  Tongue protrudes centrally and palate elevates symmetrically.    Chest: Clear to auscultation without wheezing, rhonchi or crackles noted.  Heart: S1+S2+0, regular and normal without murmurs, rubs or gallops noted.   Abdomen: Soft, non-tender and non-distended.  Extremities: There is no pitting edema in the distal lower extremities bilaterally.   Skin: Warm and dry without trophic changes noted.   Musculoskeletal: exam reveals no obvious joint deformities.   Neurologically:  Mental status: The  patient is awake, alert and oriented in all 4 spheres.  Her immediate and remote memory, attention, language skills and fund of knowledge are appropriate. There is no evidence of aphasia, agnosia, apraxia or anomia. Speech is clear with normal prosody and enunciation. Thought process is linear. Mood is normal and affect is normal.  Cranial nerves II - XII are as described above under HEENT exam.  Motor exam: Normal bulk, strength and tone is noted. There is no obvious action or resting tremor.  No drift or rebound, no postural or intention tremor. Fine motor skills and coordination:  intact finger taps, hand movements and rapid alternating patting with both upper extremities, normal foot taps bilaterally in the lower extremities.  Cerebellar testing: No dysmetria or intention tremor. There is no truncal or gait ataxia.  Normal finger-to-nose, normal heel-to-shin bilaterally. Sensory exam: intact to light touch, temperature and vibration sense in the upper and lower extremities.  Romberg negative. Reflexes 2+ throughout, toes are downgoing bilaterally. Gait, station and balance: She stands easily. No veering to one side is noted. No leaning to one side is noted. Posture is age-appropriate and stance is narrow based. Gait shows normal stride length and normal pace. No problems turning are noted.  Normal tandem walk.   Assessment and Plan:  In summary, Cassidy Lee is a very pleasant 58 y.o.-year old female 58 year old female with an underlying medical history of allergic rhinitis, irritable bowel syndrome, and mildly overweight state, who presents for evaluation of her recurrent headaches as well as recurrent dizziness, history is suggestive of intermittent vertigo spells and episodic migraines, she does not always have a migraine after a dizzy spell, feels like her vertigo is the more concerning and problematic issue, vertiginous migraines not excluded.   Neurological exam is nonfocal and she is largely  reassured.  She also had a recent head CT.    This was an extended visit of over 60 minutes with copious record review involved, considerable counseling and coordination of care and addressing multiple issues including dizziness/vertigo, migraine headaches, sleep apnea.  Below is a summary of my recommendations and our discussion points based on chart review, history from patient and husband and her examination.  She was given these instructions verbally during the visit and also in her after visit summary.  <<  Please remember, common headache triggers are: sleep deprivation, dehydration, overheating, stress, hypoglycemia or skipping meals and blood sugar fluctuations, excessive pain medications or excessive alcohol use or caffeine withdrawal. Some people have food triggers such as aged cheese, orange juice or chocolate, especially dark chocolate, or MSG (monosodium glutamate). Try to avoid these headache triggers as much possible. It may be helpful to keep a headache diary to figure out what makes your headaches worse or brings them on and what alleviates them. Some people report headache onset after exercise but studies have shown that regular exercise may actually prevent headaches from coming. If you have exercise-induced headaches, please make sure that you drink plenty of fluid before and after exercising and that you do not over do it and do not overheat. Please reduce and eliminate daily alcohol consumption as alcohol can cause sleep disturbance and may interfere with medications for headaches and dizziness.  Limit your caffeine to 1 or 2 servings per day, total of 16 ounces.  We will do a home sleep test to look for signs of obstructive sleep apnea (aka OSA). The long-term risks and ramifications of untreated moderate to severe obstructive sleep apnea may include (but are not limited to): increased risk for cardiovascular disease, including  congestive heart failure, stroke, difficult to control  hypertension, treatment resistant obesity, arrhythmias, especially irregular heartbeat commonly known as A. Fib. (atrial fibrillation); even type 2 diabetes has been linked to untreated OSA. If yo obstructive sleep apnea I would likely offer you treatment with an AutoPap machine. For headache management for as needed use, I will start you on Maxalt  5 mg tab: take 1 pill early on when you suspect a migraine attack come on. You may take another pill after 2 hours, no more than 2 pills in 24 hours. Most people who take triptans do not have any serious side-effects. However, they can cause drowsiness (remember to not drive or use heavy machinery when drowsy), nausea, dizziness, dry mouth. Less common side effects include strange sensations, such as tightness in your chest or throat, tingling, flushing, and feelings of heaviness or pressure in areas such as the face, limbs, and chest. These in the chest can mimic heart related pain (angina) and may cause alarm, but usually these sensations are not harmful or a sign of a heart attack. However, if you develop intense chest pain or sensations of discomfort, you should stop taking your medication and consult with me or your PCP or go to the nearest urgent care facility or ER or call 911.  Talk to your primary care about seeing an ENT specialist next.   We will plan a follow up after your home sleep test.   >> Thank you very much for allowing me to participate in the care of this nice patient. If I can be of any further assistance to you please do not hesitate to call me at [phone].  Sincerely,   True Mar, MD, PhD

## 2023-11-26 ENCOUNTER — Other Ambulatory Visit: Payer: Self-pay | Admitting: Neurology

## 2023-11-26 DIAGNOSIS — R0683 Snoring: Secondary | ICD-10-CM

## 2023-11-26 DIAGNOSIS — Z9189 Other specified personal risk factors, not elsewhere classified: Secondary | ICD-10-CM

## 2023-11-26 DIAGNOSIS — E663 Overweight: Secondary | ICD-10-CM

## 2023-11-26 DIAGNOSIS — R519 Headache, unspecified: Secondary | ICD-10-CM

## 2023-11-26 DIAGNOSIS — H8190 Unspecified disorder of vestibular function, unspecified ear: Secondary | ICD-10-CM

## 2023-11-26 DIAGNOSIS — G43909 Migraine, unspecified, not intractable, without status migrainosus: Secondary | ICD-10-CM

## 2024-01-12 IMAGING — MG MM DIGITAL SCREENING BILAT W/ TOMO AND CAD
8 series · 8 of 24 positions shown · non-contrast
Comparison: Previous exam(s).

CLINICAL DATA: Screening.

EXAM:
DIGITAL SCREENING BILATERAL MAMMOGRAM WITH TOMOSYNTHESIS AND CAD
TECHNIQUE: Bilateral screening digital craniocaudal and mediolateral oblique
mammograms were obtained. Bilateral screening digital breast
tomosynthesis was performed. The images were evaluated with
computer-aided detection.

[L MLO synth-2D]
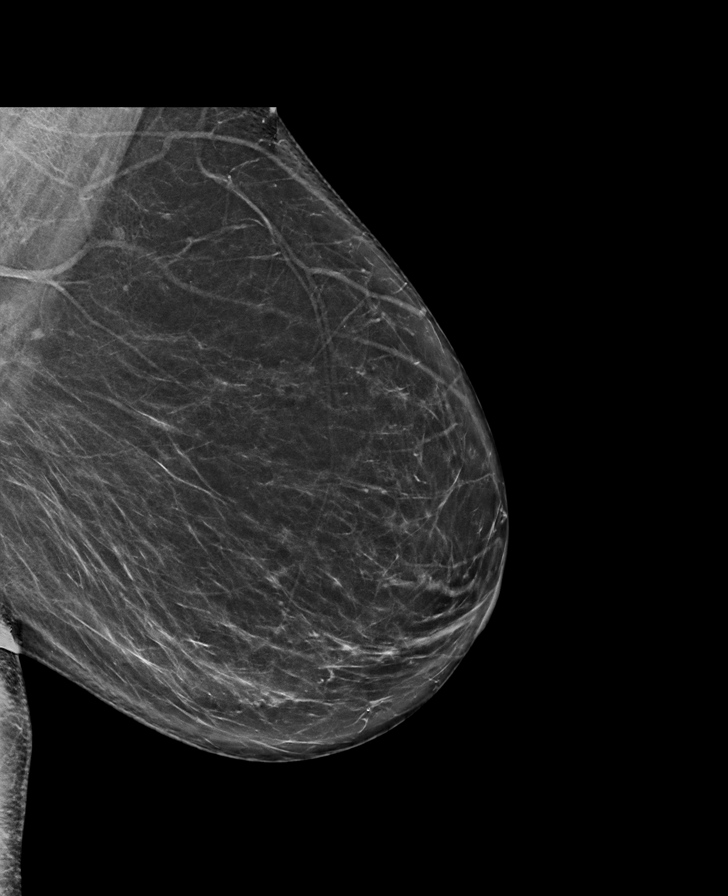

[L CC synth-2D]
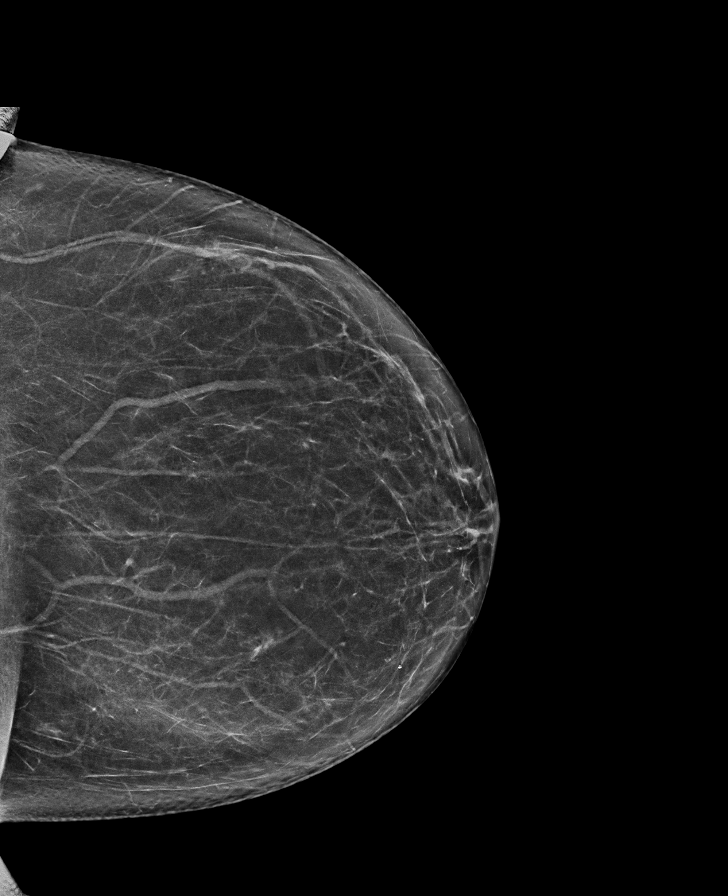

[R MLO synth-2D]
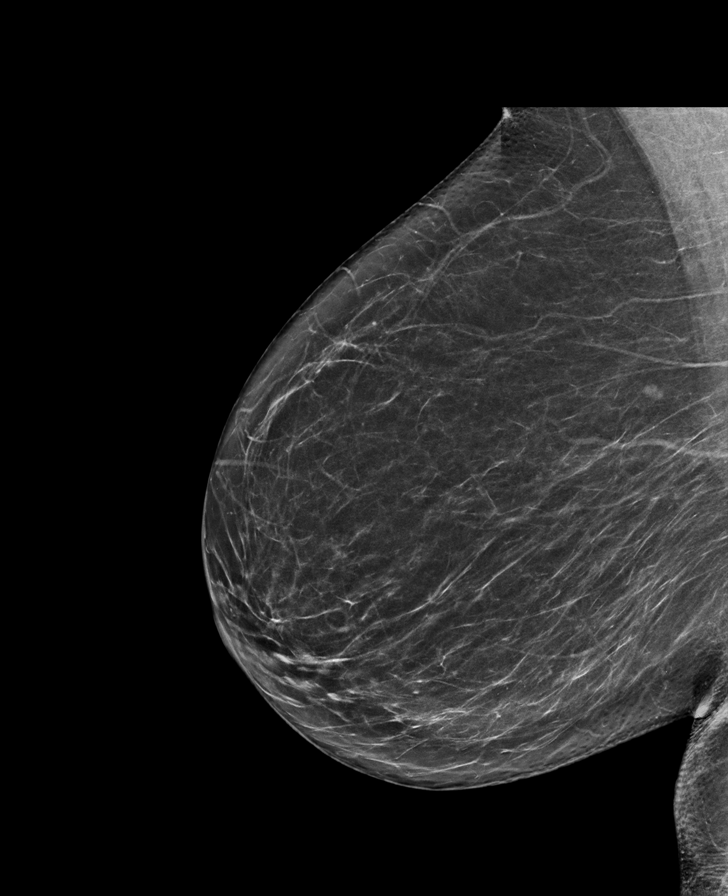

[R CC synth-2D]
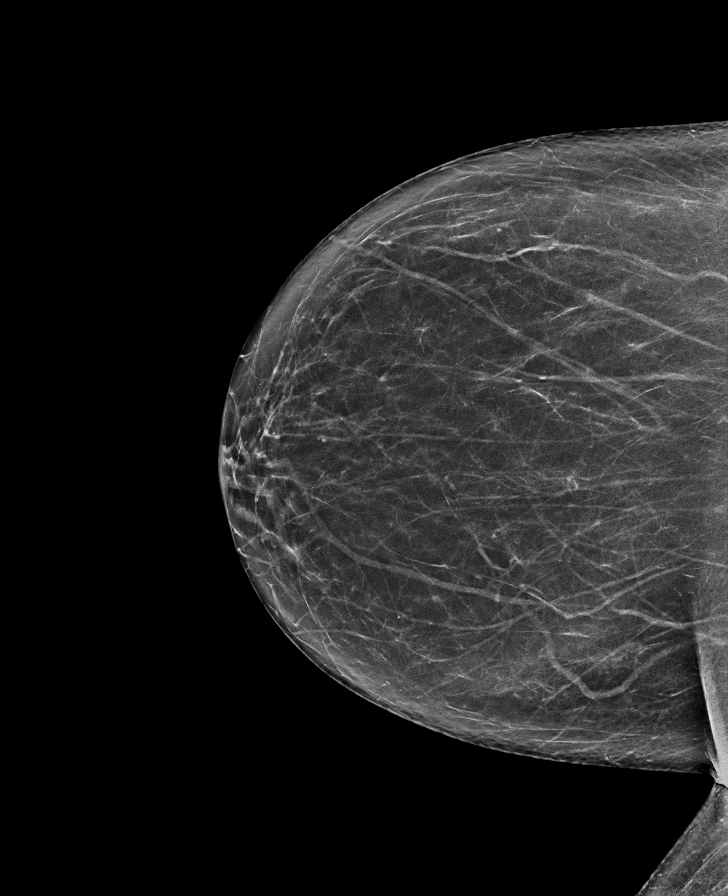

[R CC tomo · tomo slice 36/71.0]
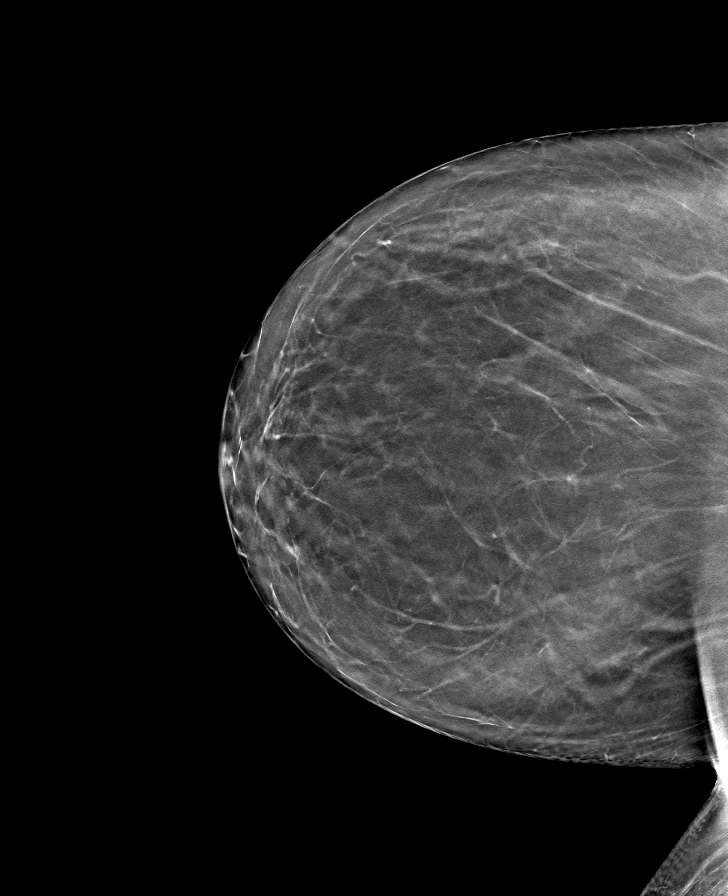

[L MLO tomo · tomo slice 39/76.0]
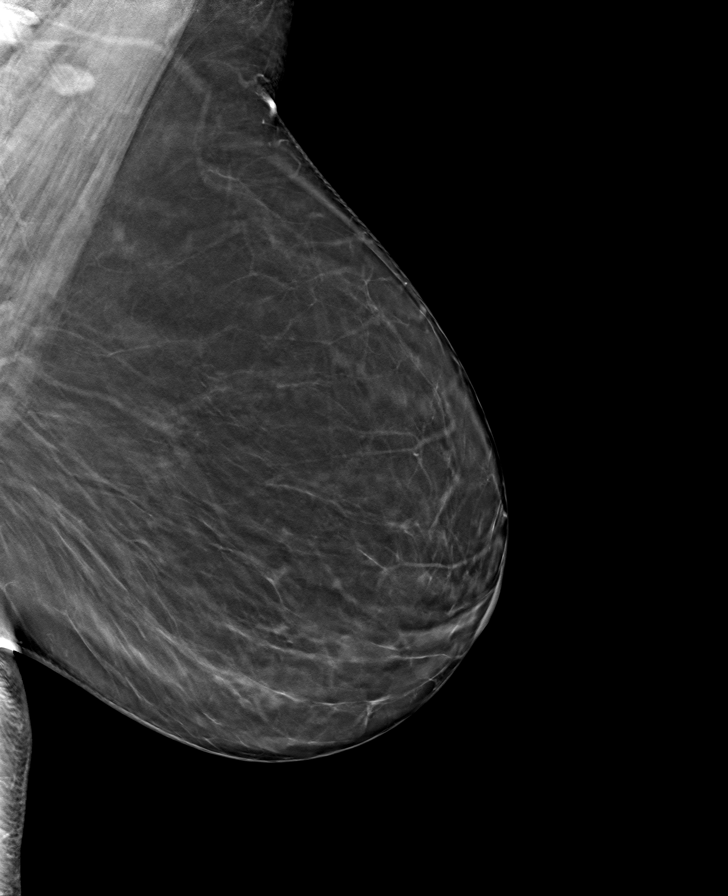

[R MLO tomo · tomo slice 37/73.0]
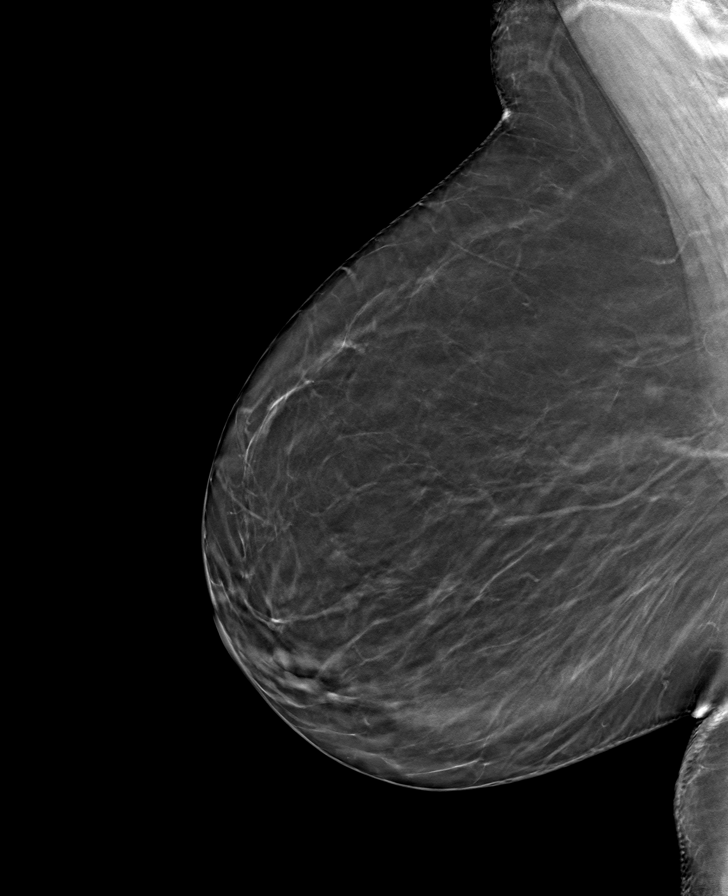

[L CC tomo · tomo slice 36/71.0]
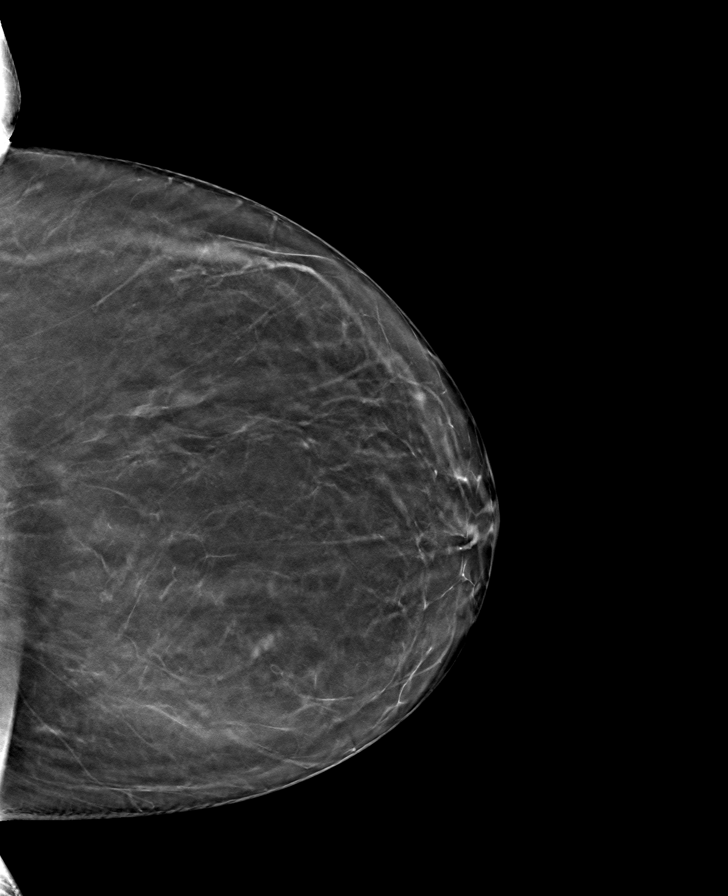

[8 of 24 positions shown; findings below may reference images not displayed]

ACR Breast Density Category b: There are scattered areas of
fibroglandular density.
FINDINGS: There are no findings suspicious for malignancy.
IMPRESSION: No mammographic evidence of malignancy. A result letter of this
screening mammogram will be mailed directly to the patient.

RECOMMENDATION:
Screening mammogram in one year. (Code:51-O-LD2)

BI-RADS CATEGORY  1: Negative.

## 2024-04-13 ENCOUNTER — Encounter: Payer: Self-pay | Admitting: Neurology

## 2024-06-14 ENCOUNTER — Encounter: Admitting: Internal Medicine
